# Patient Record
Sex: Male | Born: 1945
Health system: Southern US, Community
[De-identification: ages and names within clinical notes are randomized; demographics above are authoritative.]

## PROBLEM LIST (undated history)

## (undated) DIAGNOSIS — E119 Type 2 diabetes mellitus without complications: Secondary | ICD-10-CM

## (undated) DIAGNOSIS — R000 Tachycardia, unspecified: Secondary | ICD-10-CM

## (undated) DIAGNOSIS — N4340 Spermatocele of epididymis, unspecified: Secondary | ICD-10-CM

## (undated) DIAGNOSIS — Z860101 Personal history of adenomatous and serrated colon polyps: Secondary | ICD-10-CM

## (undated) DIAGNOSIS — Z86010 Personal history of colonic polyps: Secondary | ICD-10-CM

## (undated) DIAGNOSIS — I10 Essential (primary) hypertension: Secondary | ICD-10-CM

## (undated) DIAGNOSIS — N400 Enlarged prostate without lower urinary tract symptoms: Secondary | ICD-10-CM

## (undated) DIAGNOSIS — E785 Hyperlipidemia, unspecified: Secondary | ICD-10-CM

## (undated) DIAGNOSIS — Z8679 Personal history of other diseases of the circulatory system: Secondary | ICD-10-CM

## (undated) DIAGNOSIS — I4510 Unspecified right bundle-branch block: Secondary | ICD-10-CM

## (undated) HISTORY — PX: TONSILLECTOMY: SUR1361

## (undated) HISTORY — PX: OTHER SURGICAL HISTORY: SHX169

## (undated) HISTORY — DX: Essential (primary) hypertension: I10

---

## 1997-12-02 ENCOUNTER — Inpatient Hospital Stay (HOSPITAL_COMMUNITY): Admission: EM | Admit: 1997-12-02 | Discharge: 1997-12-03 | Payer: Self-pay | Admitting: Cardiology

## 1997-12-13 ENCOUNTER — Ambulatory Visit (HOSPITAL_COMMUNITY): Admission: RE | Admit: 1997-12-13 | Discharge: 1997-12-13 | Payer: Self-pay | Admitting: Internal Medicine

## 1998-05-17 DIAGNOSIS — Z8679 Personal history of other diseases of the circulatory system: Secondary | ICD-10-CM

## 1998-05-17 HISTORY — DX: Personal history of other diseases of the circulatory system: Z86.79

## 1998-09-15 HISTORY — PX: CARDIAC CATHETERIZATION: SHX172

## 1998-10-06 ENCOUNTER — Inpatient Hospital Stay (HOSPITAL_COMMUNITY): Admission: EM | Admit: 1998-10-06 | Discharge: 1998-10-07 | Payer: Self-pay | Admitting: Cardiology

## 1998-10-06 HISTORY — PX: TRANSTHORACIC ECHOCARDIOGRAM: SHX275

## 1998-10-20 ENCOUNTER — Ambulatory Visit (HOSPITAL_COMMUNITY): Admission: RE | Admit: 1998-10-20 | Discharge: 1998-10-20 | Payer: Self-pay | Admitting: Internal Medicine

## 2003-04-01 ENCOUNTER — Ambulatory Visit (HOSPITAL_COMMUNITY): Admission: RE | Admit: 2003-04-01 | Discharge: 2003-04-01 | Payer: Self-pay | Admitting: Internal Medicine

## 2006-08-19 ENCOUNTER — Emergency Department (HOSPITAL_COMMUNITY): Admission: EM | Admit: 2006-08-19 | Discharge: 2006-08-19 | Payer: Self-pay | Admitting: Emergency Medicine

## 2007-01-09 ENCOUNTER — Ambulatory Visit: Payer: Self-pay | Admitting: Cardiovascular Disease

## 2007-01-09 ENCOUNTER — Observation Stay (HOSPITAL_COMMUNITY): Admission: EM | Admit: 2007-01-09 | Discharge: 2007-01-10 | Payer: Self-pay | Admitting: Emergency Medicine

## 2008-04-23 ENCOUNTER — Ambulatory Visit: Payer: Self-pay | Admitting: Internal Medicine

## 2008-05-07 ENCOUNTER — Ambulatory Visit (HOSPITAL_COMMUNITY): Admission: RE | Admit: 2008-05-07 | Discharge: 2008-05-07 | Payer: Self-pay | Admitting: Urology

## 2008-05-14 ENCOUNTER — Ambulatory Visit: Payer: Self-pay | Admitting: Internal Medicine

## 2008-05-14 ENCOUNTER — Ambulatory Visit (HOSPITAL_COMMUNITY): Admission: RE | Admit: 2008-05-14 | Discharge: 2008-05-14 | Payer: Self-pay | Admitting: Internal Medicine

## 2008-05-14 ENCOUNTER — Encounter: Payer: Self-pay | Admitting: Internal Medicine

## 2009-06-17 ENCOUNTER — Ambulatory Visit (HOSPITAL_COMMUNITY): Admission: RE | Admit: 2009-06-17 | Discharge: 2009-06-17 | Payer: Self-pay | Admitting: Family Medicine

## 2010-05-25 ENCOUNTER — Ambulatory Visit (HOSPITAL_COMMUNITY)
Admission: RE | Admit: 2010-05-25 | Discharge: 2010-05-25 | Payer: Self-pay | Source: Home / Self Care | Attending: Family Medicine | Admitting: Family Medicine

## 2010-09-29 NOTE — H&P (Signed)
[name], Hunter Peterson             ACCOUNT NO.:  [id number]   MEDICAL RECORD NO.:  [id number]          PATIENT TYPE:  INP   LOCATION:  A208                          FACILITY:  [facility]   PHYSICIAN:  Skeet Latch, DO    DATE OF BIRTH:  [birth year]   DATE OF ADMISSION:  01/09/2007  DATE OF DISCHARGE:  LH                              HISTORY & PHYSICAL   CHIEF COMPLAINT:  Chest pain.   HISTORY OF PRESENT ILLNESS:  This is a 65 year old Caucasian male who  presents with complaint of epigastric chest pain.  The patient states  that the pain started this a.m. while sitting.  The patient describes  the pain as sharp in nature with a pressure-type sensation in the middle  of his chest.  The patient denies any radiation to his back, his jaw or  any extremities.  The patient does state that it makes him nauseated and  he had some episodes of sweating earlier today.  The patient states that  after having this pain this a.m., the patient procrastinated.  He did  not seek any help, but waited until this evening.  The patient then  drove himself to emergency room for evaluation.  The patient states that  the nitroglycerin given in emergency room has not helped the pain.  The  patient continues to have epigastric-type pain/pressure.  The patient  also admits to not having a bowel movement for approximately 3-4 days  and states that this feels like gas .   PAST MEDICAL HISTORY:  1. Non-insulin dependent diabetes.  2. Hyperlipidemia.  3. Hypertension.  4. Episode of arrhythmia approximately 10 years prior.   PAST SURGICAL HISTORY:  None.   ALLERGIES:  None.   REVIEW OF SYSTEMS:  GENERAL:  Positive for some sweating.  Denies any  weakness, weight loss, change in appetite, dizziness, fatigue.  HEENT:  No eye pain, visual changes.  NECK:  No throat problems.  CARDIOVASCULAR:  Positive for epigastric pain.  Denies any palpitations.  RESPIRATORY: Denies any cough, wheezing, shortness of breath.  GASTROINTESTINAL:  Positive for nausea.  Denies any vomiting, diarrhea,  positive for constipation.  GENITOURINARY: No dysuria, urgency.  MUSCULOSKELETAL: No arthralgias or arthritis.  Other systems are  negative.   HOME MEDICATIONS:  The patient is on Toprol XL once a day, Avandia and  Lipitor.   PHYSICAL EXAMINATION:  VITAL SIGNS: Temperature is 97.4, respirations  24, heart rate 74, blood pressure 117/73.  The patient is sating 95% on  2 liters.  HEENT: Head is atraumatic, normocephalic.  PERRL.  EOMI.  NECK:  Soft, supple, nontender, nondistended.  Oral mucosa was moist.  NECK:  Supple, soft.  CARDIOVASCULAR:  Regular rate and rhythm.  No murmurs, rubs, gallops.  RESPIRATORY:  Breath sounds equal bilaterally.  No rales, rhonchi or  wheezing.  CHEST:  Clear.  ABDOMEN:  Soft.  Slight tenderness in epigastric area.  No rigidity or  guarding.  No hepatosplenomegaly.  Positive positive bowel sounds.  EXTREMITIES:  No clubbing, cyanosis or edema.  NEUROLOGIC: Cranial nerves II-XII grossly intact. Moves all extremities.  No clubbing, cyanosis  or edema.   LABORATORY:  Hemoglobin 15.3, hematocrit 45.4, white count 12.1,  platelets 178.  Sodium 139, potassium 3.7, chloride 103, CO2 30, BUN 15,  creatinine 0.93, glucose 171, PTT 13.5, INR 1.0, PTT 34, AST 50, ALT 24,  alkaline phosphatase 48, total bilirubin is 2.1.  So far, three sets of  troponins and negative.  TSH is pending.   ASSESSMENT:  1. Atypical chest pain.  2. Non-insulin-dependent diabetes.  3. Hypertension.  4. Hyperlipidemia.   PLAN:  1. For the patient's chest pain, will consult Cardiology secondary the      patient's risk factors.  Will also give the patient Mylicon and      Fleet's enema as well as Colace to stimulate his bowels.  The      patient has nitroglycerin q. 5 minutes p.r.n. as needed.  Will      repeat EKG in a.m.  2. For his non-insulin-dependent diabetes, will get blood sugars at      a.c. and h.s.   The patient will be placed on sensitive sliding      scale and hemoglobin A1c will be checked in the a.m..  3. For hypertension, tends to be stable at this time.  Continue his      home medications.  4. For his hyperlipidemia, continue his home medications and check a      lipid panel in the a.m..  The patient will be given DVT as well as      GI prophylaxis.      Skeet Latch, DO  Electronically Signed     SM/MEDQ  D:  01/10/2007  T:  01/10/2007  Job:  [id number]

## 2010-09-29 NOTE — Discharge Summary (Signed)
Hunter Peterson, Hunter Peterson             ACCOUNT NO.:  [id number]   MEDICAL RECORD NO.:  [id number]          PATIENT TYPE:  INP   LOCATION:  A208                          FACILITY:  [facility]   PHYSICIAN:  Dorris Singh, DO    DATE OF BIRTH:  1945/07/10   DATE OF ADMISSION:  01/09/2007  DATE OF DISCHARGE:  08/26/2008LH                               DISCHARGE SUMMARY   ADMISSION DIAGNOSES:  1. Atypical chest pain.  2. Non-insulin-dependent diabetes.  3. Hypertension.  4. Hyperlipidemia.   DISCHARGE DIAGNOSES:  1. Atypical chest pain.  2. Non-insulin-dependent diabetes.  3. Hypertension.  4. Hyperlipidemia.   PRIMARY CARE PHYSICIAN:  Dr. Patrica Duel.   CONSULTATIONS:  The patient was seen by Dr. Eden Emms of [facility]  since he has not had a cardiologist in a long time.   PROCEDURES:  1. On January 09, 2007 patient had a portable chest x-ray which      demonstrated questionable pneumonia versus atelectasis in the right      lobe and right middle lobe.  2. CT of the chest revealed bibasilar atelectasis, no evidence of      aortic distention or pulmonary embolism.  3. CT of abdomen revealed no acute upper abdominal abnormalities.   HISTORY AND PHYSICAL:  The patient is a 65 year old Caucasian male  presenting with complaint of epigastric chest pain.  He states this  started this a.m. while sitting and he described the pain as sharp in  nature with a pressure type sensation in the middle of his chest.  The  patient denies any radiation to his back,  jaw or extremities.  He does  not state that it makes him nauseated but has had some episodes of  sweating earlier.  At this point in time he said the pain did not go  away and he drove himself to the emergency room for evaluation.  Upon  evaluation patient had CT angiogram done of chest and abdomen as well as  a chest x-ray.  His hemoglobin and his labs were within normal limits.  Patient's pertinent positive was that his white  count was 12.1 thousand  on admission but is 11.6 thousand today.  All other labs are within  normal limits except for a glucose of 333 today.   HOSPITAL COURSE:  The patient denied any chest pain during the night,  did not have any reproduction of that pain and was seen by the night  Hospitalist who actually felt like he was doing better.  Cardiology saw  him this morning, Dr. Eden Emms, due to patient's past medical history with  a stress test several years ago. Patient actually went into ventricular  tachycardia while having the test and he does not want a stress test  done.  At this point in time it was determined that patient could be see  the cardiologist of his choice from Dr. Geanie Logan recommendations per  patient request that he have a cardiac catheterization to determine if  there is any other issue regarding heart disease at this time.  At this  point in time patient did not want to  pursue any more testing which  includes a stress test and would wait until he met with his family  practitioner.  At this point in time patient still has an elevated white  count. I will send him home on empiric antibiotics and will have him  follow up with Dr. Geanie Logan office in 1-3 days.  He is discharged to  home and he is stable.   He will be discharged on his home medications which include:  1. Toprol once daily.  2. Avandia.  3. Lipitor. (We do not have doses for his current medications).  4. Also he will be sent home on Levaquin 500 mg p.o. daily x5 days.   The patient understands the importance of followup with cardiology to  determine etiology of this chest pain and states that he is concerned  and would like to find out why this occurred.      Dorris Singh, DO  Electronically Signed     CB/MEDQ  D:  01/10/2007  T:  01/10/2007  Job:  [id number]   cc:   Noralyn Pick. Eden Emms, MD, [facility]  [address]  [address]  [place]  [place] [address]   Patrica Duel, M.D.  Fax: [phone]

## 2010-09-29 NOTE — Op Note (Signed)
NAME:  Hunter Peterson, Hunter Peterson             ACCOUNT NO.:  [id number]   MEDICAL RECORD NO.:  [id number]          PATIENT TYPE:  AMB   LOCATION:  DAY                           FACILITY:  [facility]   PHYSICIAN:  R. Roetta Sessions, M.D. DATE OF BIRTH:  May 23, 1945   DATE OF PROCEDURE:  05/14/2008  DATE OF DISCHARGE:                               OPERATIVE REPORT   INDICATIONS FOR PROCEDURE:  A 65 year old gentleman with history of  adenomatous polyps.  Last colonoscopy in 2004, he had a rectal polyp  removed which was an adenoma.  He is devoid of any lower GI tract  symptoms.  Colonoscopy is now being done.  Risks, benefits,  alternatives, and limitations have been reviewed, questions answered,  all parties agreeable.  Please see the documentation in the medical  record.   PROCEDURE NOTE:  O2 saturation, blood pressure, pulse, and respirations  were monitored throughout the entire procedure.   CONSCIOUS SEDATION:  Versed 4 mg IV and Demerol 75 mg IV in divided  doses.   INSTRUMENT:  Pentax video chip system.   FINDINGS:  Digital rectal exam revealed no abnormalities.  Endoscopic  findings:  These prep was good.  Colon:  Colonic mucosa was surveyed  from the rectosigmoid junction through the left transverse, right colon,  appendiceal orifice, ileocecal valve, and cecum.  These structures were  well seen and photographed for the record.  Terminal ileum was elevated  to 5 cm.  From this level, scope was slowly and cautiously withdrawn.  All previously mentioned mucosal surfaces were again seen.  The patient  had a 6-mm pedunculated polyp on a fold in the cecum, which was hot  snare removed, recovered through the scope.  The remainder of the  colonic mucosa appeared normal.  Terminal ileum mucosa appeared normal.  The scope was pulled down in the rectum with examination of the rectal  mucosa including retroflexion, anal verge demonstrated area of scar  which corresponded to the site of prior  polypectomy.  Otherwise rectal  mucosa appeared entirely normal.  The patient tolerated the procedure  well and reacted in Endoscopy.   IMPRESSION:  1. Rectal scar from prior polypectomy, otherwise normal rectal mucosa.  2. Pedunculated polyp in the cecum, status post hot snare polypectomy.      Remainder colonic mucosa appeared normal, normal terminal ileum.   RECOMMENDATION:  1. No aspirin or arthritis medications for 5 days.  2. Follow up on path.  3. Further recommendations to follow.      Jonathon Bellows, M.D.  Electronically Signed     RMR/MEDQ  D:  05/14/2008  T:  05/14/2008  Job:  [id number]   cc:   Patrica Duel, M.D.  Fax: [phone]

## 2010-09-29 NOTE — H&P (Signed)
NAME:  Polio, Ancel             ACCOUNT NO.:  [id number]   MEDICAL RECORD NO.:  [id number]          PATIENT TYPE:  AMB   LOCATION:  DAY                           FACILITY:  [facility]   PHYSICIAN:  R. Roetta Sessions, M.D.      DATE OF BIRTH:   DATE OF ADMISSION:  DATE OF DISCHARGE:  LH                              HISTORY & PHYSICAL   PRIMARY CARE PHYSICIAN:  Patrica Duel, MD   PHYSICIAN SIGNING THIS NOTE:  R. Roetta Sessions, MD   CHIEF COMPLAINT:  History of adenomatous polyps due for surveillance  colonoscopy.   HISTORY OF PRESENT ILLNESS:  Mr. Brandenburg is a 65 year old Caucasian  male.  He had a screening colonoscopy by Dr. Jena Gauss on April 01, 2003.  He was found to have a pedunculated rectal polyp which was removed with  snare cautery, it was an adenomatous polyp.  He is due for surveillance.  He denies any GI problems at this time to specifically include rectal  bleeding, melena, constipation, diarrhea, abdominal pain, nausea, or  vomiting.  Denies any history of heartburn or indigestion.  Denies any  dysphagia, odynophagia, anorexia, or early satiety.  His weight has  remained stable.   PAST MEDICAL AND SURGICAL HISTORY:  Diabetes mellitus, hypertension,  hypercholesterolemia, arrhythmia, and he has recently been treated for  prostatitis and is having continued prostate problems for which she has  been referred to Urology with an appointment pending.  He is status post  tonsillectomy.   CURRENT MEDICATIONS:  1. Avandia 4 mg daily.  2. Toprol 25 mg daily.  3. Lipitor 10 mg daily.   ALLERGIES:  No known drug allergies.   FAMILY HISTORY:  There is no known family history of colorectal  carcinoma, liver or chronic GI problems.  Mother deceased at 70 with  history of dementia.  Father deceased at 61 with history of lymphoma.  He has 3 healthy siblings.   SOCIAL HISTORY:  Mr. Demmer is married.  He has 3 grown healthy  children.  He is employed with [employer].   He denies any  tobacco or drug use.  He consumes an occasional beer, consumes about 4  ounces of red wine in the evenings.   REVIEW OF SYSTEMS:  See HPI.  He is having some left scrotal pain and  swelling, again which is being referred to Urology at this point,  otherwise denies any problems.  See HPI.   PHYSICAL EXAMINATION:  VITAL SIGNS:  Weight 196 pounds, height 71  inches, temp 97.5, blood pressure 140/90, and pulse 64.  GENERAL:  He is well-developed, well-nourished Caucasian male in no  acute distress.  HEENT:  Sclerae clear, nonicteric.  Conjunctivae pink.  Oropharynx pink  and moist without any lesions.  NECK:  Supple without mass or thyromegaly.  CHEST:  Heart regular rate and rhythm.  Normal S1 and S2 without  murmurs, clicks, rubs, or gallops.  LUNGS:  Clear to auscultation bilaterally.  ABDOMEN:  Positive bowel sounds x4.  No bruits auscultated.  Soft,  nontender, nondistended without palpable mass or hepatosplenomegaly.  No  rebound tenderness or guarding.  I was unable to appreciate any hernias.  RECTAL:  Deferred.  EXTREMITIES:  Without clubbing or edema.   IMPRESSION:  Mr. Strey is a 65 year old Caucasian male with history of  adenomatous rectal polyp.  He is due for surveillance colonoscopy at  this time.  He denies any GI complaints.   PLAN:  1. Surveillance colonoscopy with Dr. Jena Gauss in near future.  Discuss      procedure risks and benefits, which include but not limited to      bleeding, infection, perforation, drug reaction.  He agrees and      planned consent will be obtained.  2. He is going to take half of his Avandia the day of the prep, as he      is concerned with hypoglycemia he has experienced previously.  He      will have early morning colonoscopy appointment and will bring his      Avandia with him to the hospital to take after the procedure.      Lorenza Burton, N.P.      Jonathon Bellows, M.D.  Electronically Signed    KJ/MEDQ   D:  04/23/2008  T:  04/24/2008  Job:  [id number]   cc:   Patrica Duel, M.D.  Fax: [phone]

## 2010-09-29 NOTE — Consult Note (Signed)
NAME:  Hunter Peterson, Hunter Peterson             ACCOUNT NO.:  [id number]   MEDICAL RECORD NO.:  [id number]          PATIENT TYPE:  INP   LOCATION:  A208                          FACILITY:  [facility]   PHYSICIAN:  Peter C. Eden Emms, MD, FACCDATE OF BIRTH:  22-Jun-1945   DATE OF CONSULTATION:  01/10/2007  DATE OF DISCHARGE:                                 CONSULTATION   Hunter Peterson is a delightful 65 year old patient who we were asked to  help evaluate by the [facility] service, Dr. Edsel Petrin, for chest  pain.   The patient had been doing well up until Sunday.  He went to [place] to visit his daughter.  He slept in a motel room.  He started  having left shoulder pain.  The pain sounded more musculoskeletal.  He  subsequently started having some epigastric type pain and pressure.  He  could not belch.  He got constipated.  He had some diaphoresis.  He went  to see Dr. Patrica Duel who did an EKG which was unrevealing.  However,  Mark sent him to the emergency room and he was admitted.   He did have nitroglycerin in the emergency room and this was not  helpful.  Again in the emergency room, his primary complaint revolved  around his GI tract with inability to belch and the lack of a bowel  movement in three to four days.  He said the pressure in his epigastric  and chest area did feel like gas.   His coronary risk factors include positive family history of  hyperlipidemia, hypertension, non-insulin dependent diabetes.   His pain is currently improved.  He does not carry a previous history of  gallbladder disease, reflux or diverticulitis despite having epigastric  pain, constipation.  He did not have any significant vomiting.  There  was slight nausea and diaphoresis.   Patient's past cardiac history is somewhat interesting.  Back in June  2000, he was referred for a stress test.  He describes it as a very bad  experience.  He said what sounds like a PA had done the test.  He felt  they  were not paying attention.  The patient apparently had exercise  induced VT and felt very ill.  He had to get transferred to the  emergency room and defibrillated.  He subsequently had an extensive work-  up.  I actually performed his heart cath back in 2000 which was entirely  normal.  He had a normal echo.  He also had an EP study and despite a  very aggressive protocol.  He was noninducible.  The thought was that he  may have had a triggered VT from exercise and he was sent home on beta-  blockers.  He has done extremely well over the last eight years.   In regards to this, he has not had any previous chest pain, he has not  had palpitations or syncope.   The patient has recently been diagnosed with diabetes over the past  year.  He tries to be compliant with his diet.   He is fairly sedentary.  REVIEW OF SYSTEMS:  Otherwise negative.   PAST MEDICAL HISTORY:  1. Non-insulin dependent diabetes.  2. Hyperlipidemia.  3. History of exercise induced VT.  4. He has not had any previous surgeries.   ALLERGIES:  He denies any allergies.   HOME MEDICATIONS:  1. Toprol, I believe is 50 mg a day.  2. Avandia, dose unknown.  3. Cholesterol medicine, again not well documented in the chart, but      the patient seems to indicate it is a Statin drug.   MEDICATIONS IN-HOSPITAL:  1. Zithromax 500 mg.  2. An aspirin a day.  3. Protonix 40 a day.  4. Nitroglycerin p.r.n.  5. Xanax.  6. Ambien.  7. Ceftriaxone 1 g.  8. Xopenex.  9. Hydrocodone.   SOCIAL HISTORY:  Remarkable for being happily married for 38 years.  His  wife is with him in the room.  He has two children, one in [place], one in [place], [place], and one grandchild.  He  works for [employer] as a Pensions consultant.  His passion is restoring  old cars.  He has more than 10 vehicles at home.  He does not smoke or  drink.   His mother died of old age.  His father died of heart problems.  His  initial  heart problems started in his 20s.   PHYSICAL EXAMINATION:  VITAL SIGNS:  Temperature 97.1, pulse 80 and  regular, respirations 20, blood pressure 121/79, sats 92% on room air,  CBGs this morning are 146.  Weight is not recorded.  GENERAL APPEARANCE:  A healthy-appearing, middle aged white male in no  acute distress.  Affect is appropriate.  HEENT:  Normal.  NECK:  Carotids normal without bruit.  There is no lymphadenopathy, no  thyromegaly, no JVP elevation.  LUNGS:  His lungs are currently clear with mild atelectasis at the right  base, no wheezing.  CARDIOVASCULAR:  There is an S1 and S2 with normal heart sounds.  PMI is  normal.  ABDOMEN:  Currently benign.  Bowel sounds are positive.  There is no  tenderness, no  hepatosplenomegaly.  No hepatojugular reflux, no bruits,  no AAA.  He has no right upper quadrant tenderness.  EXTREMITIES:  Femorals are +4 bilaterally without bruit.  PTs are +3.  There is no lower extremity edema.  NEUROLOGIC:  Nonfocal.  There is no muscular weakness.  SKIN:  Warm and dry.   His blood work is remarkable for a TSH of 1.8, potassium 3.9, BUN 18,  creatinine 0.7.  White count was elevated at 11.6, hematocrit was normal  at 41.  He has mild elevation in his bilirubin at 2.1 with indirect of  1.8.  Troponins are negative x2 at 0.05.   He had a CT of the abdomen and chest which were unrevealing.  There was  a question of right lower lobe and right middle lobe atelectasis.   His EKG showed sinus rhythm with a chronic right bundle branch block  which was documented over eight years ago.   IMPRESSION:  1. Atypical chest or epigastric pain.  He has ruled out for myocardial      infarction.  I will order 2-D echocardiograms.  As long as it is      normal, I suspect he can be discharged in a day or two.  The      patient flat out told me he would not have another stress test.      This  was a bad experience eight years ago when he went into       ventricular tachycardia.  I explained to him that he does have      multiple risk factors and the only other way to screen him would be      with a heart cath.  I am not sure if insurance would cover a      cardiac CT, however, I think these issues can be dealt with as an      outpatient.  I would continue his aspirin and beta-blocker therapy.  2. History of exercise induced ventricular tachycardia, noninducible      by electrophysiology study, no recurrence of palpitations or      syncope.  This would appear to have been a benign, possibly      triggered event that has not been reproducible and not clinically      significant.  Continue beta-blocker.  3. Question atelectasis or pneumonia.  The patient is on multiple      antibiotics that he should be simplified.  He is not acting      infected, although he did have an elevated white count.  Incentive      spirometry and p.o. antibiotics should probably suffice.  Follow-up      chest x-ray would be indicated.  4. Non-insulin dependent diabetes.  Need to get his home medication      list.  I do not see that he is on Avandia here.  The patient should      have a hemoglobin A1c.  His fasting sugar appeared elevated this      morning.  We will leave this up to his primary service.  5. Hypertension.  Currently well controlled.  Again, I do not see a      home medication list but clearly he is on beta-blockers and I      suspect he is on an ACE inhibitor.  Tight control of this with his      other risk factors will be important.  6. Hyperlipidemia.  Again, talked to Dr. Nobie Putnam.  Checked fasting      lipid and liver profile.  Goal for LDL would be under 100.      Elevated white count and mildly elevated bilirubin.  His elevated      bilirubin may very well represent Gilbert's disease.  He is not      acting like he has significant ductal gallbladder disease.  He has      no tenderness.  His white count should be followed.  If there is       any question of increasing bilirubins, he should have a right upper      quadrant ultrasound, however, his abdominal CT scan on admission      was negative.   We will arrange outpatient follow-up for the patient.  Again, he  indicated he would be more amenable to a heart cath than any repeat  stress testing.   From our prospective, as long as his echo shows a normal left  ventricular function, he can be discharged tomorrow whenever these other  issues are resolved.      Noralyn Pick. Eden Emms, MD, [facility]  Electronically Signed     PCN/MEDQ  D:  01/10/2007  T:  01/10/2007  Job:  [id number]

## 2010-10-02 NOTE — Op Note (Signed)
NAME:  Hunter Peterson, Hunter Peterson                       ACCOUNT NO.:  [id number]   MEDICAL RECORD NO.:  [id number]                   PATIENT TYPE:  AMB   LOCATION:  DAY                                  FACILITY:  [facility]   PHYSICIAN:  R. Roetta Sessions, M.D.              DATE OF BIRTH:  June 04, 1945   DATE OF PROCEDURE:  04/01/2003  DATE OF DISCHARGE:                                 OPERATIVE REPORT   PROCEDURE:  Colonoscopy with snare polypectomy.   INDICATIONS FOR PROCEDURE:  Mr. Geisel is a 65 year old gentleman sent over  by the courtesy of Dr. Nobie Putnam for colorectal cancer screening. He was  devoid of any lower GI tract symptoms, there is no family history of  colorectal neoplasia and he has never had his colon imaged. Colonoscopy is  now being done as a standard screening maneuver. This approach has been  discussed with the patient at length. The potential risks, benefits, and  alternatives have been reviewed. Please see my handwritten H&P for more  information.   MONITORING:  O2 saturation, blood pressure, pulse and respirations were  monitored throughout the entire procedure.   CONSCIOUS SEDATION:  Versed 3 mg IV, Demerol 50 mg IV in divided doses.   INSTRUMENT:  Olympus videochip adult colonoscope.   FINDINGS:  Digital rectal exam revealed no abnormalities.   ENDOSCOPIC FINDINGS:  The prep was good.   RECTUM:  Examination of the rectal mucosa including retroflexed view of the  anal verge revealed a pedunculated 1 cm polyp at 12 cm. Please see photos.  The remainder of the rectal mucosa appeared normal.   COLON:  The colonic mucosa was surveyed from the rectosigmoid junction  through the left transverse right colon to the area of the appendiceal  orifice, ileocecal valve and cecum. These structures were seen and  photographed for the record. The colonic mucosa all the way to the cecum  appeared normal. The terminal ileum was easily intubated and the distal TI  appeared normal  as well. From the level of the ileocecal valve, the scope  was slowly withdrawn. All previously mentioned mucosal surfaces were again  seen and no other abnormalities were observed. The polyp in the rectum was  removed with snare cautery and recovered through the scope. The patient  tolerated the procedure well and was reacted in endoscopy.   IMPRESSION:  Pedunculated recta polyp removed with a snare otherwise normal  rectum, normal colon, normal terminal ileum.    RECOMMENDATIONS:  1. No aspirin or arthritis medications for 10 days.  2. Followup on path.  3. Further recommendations to follow.      ___________________________________________                                            Jonathon Bellows, M.D.   RMR/MEDQ  D:  04/01/2003  T:  04/01/2003  Job:  [id number]   cc:   Patrica Duel, M.D.  [address], [address]  [place]  [place] [address]  Fax: [phone]

## 2011-02-26 LAB — CBC
HCT: 41
HCT: 45.4
Hemoglobin: 14.1
Hemoglobin: 15.3
MCHC: 33.6
MCHC: 34.3
MCV: 90.3
MCV: 92.1
Platelets: 174
Platelets: 178
RBC: 4.92
RDW: 13.4
WBC: 12.1 — ABNORMAL HIGH

## 2011-02-26 LAB — BASIC METABOLIC PANEL WITH GFR
BUN: 15
BUN: 18
CO2: 26
CO2: 30
Calcium: 8.6
Calcium: 8.8
Chloride: 103
Chloride: 103
Creatinine, Ser: 0.74
Creatinine, Ser: 0.93
GFR calc non Af Amer: >60
GFR calc non Af Amer: >60
Glucose, Bld: 133 — ABNORMAL HIGH
Glucose, Bld: 171 — ABNORMAL HIGH
Potassium: 3.7
Potassium: 3.9
Sodium: 137
Sodium: 139

## 2011-02-26 LAB — POCT CARDIAC MARKERS
CKMB, poc: <1 — ABNORMAL LOW
CKMB, poc: <1 — ABNORMAL LOW
Myoglobin, poc: 36.7
Myoglobin, poc: 38.8
Myoglobin, poc: 51.4
Operator id: 213931
Operator id: 263501
Troponin i, poc: <0.05
Troponin i, poc: <0.05

## 2011-02-26 LAB — DIFFERENTIAL
Basophils Absolute: 0
Basophils Relative: 0
Eosinophils Absolute: 0
Lymphs Abs: 0.7
Neutro Abs: 10.3 — ABNORMAL HIGH
Neutro Abs: 9.5 — ABNORMAL HIGH
Neutrophils Relative %: 82 — ABNORMAL HIGH
Neutrophils Relative %: 85 — ABNORMAL HIGH

## 2011-02-26 LAB — LIPID PANEL
Cholesterol: 149
HDL: 46
LDL Cholesterol: 85
Total CHOL/HDL Ratio: 3.2
Triglycerides: 88

## 2011-02-26 LAB — PROTIME-INR
INR: 1
Prothrombin Time: 13.5

## 2011-02-26 LAB — HEPATIC FUNCTION PANEL
ALT: 24
AST: 15
Albumin: 4
Alkaline Phosphatase: 48
Bilirubin, Direct: 0.3
Indirect Bilirubin: 1.8 — ABNORMAL HIGH
Total Bilirubin: 2.1 — ABNORMAL HIGH
Total Protein: 7.6

## 2011-02-26 LAB — APTT: aPTT: 34

## 2011-02-26 LAB — CULTURE, BLOOD (ROUTINE X 2)
Culture: NO GROWTH
Culture: NO GROWTH
Culture: NO GROWTH
Culture: NO GROWTH
Report Status: 8302008

## 2011-02-26 LAB — HEMOGLOBIN A1C
Hgb A1c MFr Bld: 6.1
Mean Plasma Glucose: 140

## 2011-02-26 LAB — TSH: TSH: 1.854

## 2013-02-14 DIAGNOSIS — M5380 Other specified dorsopathies, site unspecified: Secondary | ICD-10-CM | POA: Diagnosis not present

## 2013-02-14 DIAGNOSIS — Z125 Encounter for screening for malignant neoplasm of prostate: Secondary | ICD-10-CM | POA: Diagnosis not present

## 2013-02-14 DIAGNOSIS — Z6825 Body mass index (BMI) 25.0-25.9, adult: Secondary | ICD-10-CM | POA: Diagnosis not present

## 2013-02-14 DIAGNOSIS — E785 Hyperlipidemia, unspecified: Secondary | ICD-10-CM | POA: Diagnosis not present

## 2013-02-14 DIAGNOSIS — E119 Type 2 diabetes mellitus without complications: Secondary | ICD-10-CM | POA: Diagnosis not present

## 2013-02-14 DIAGNOSIS — Z23 Encounter for immunization: Secondary | ICD-10-CM | POA: Diagnosis not present

## 2013-02-14 DIAGNOSIS — I10 Essential (primary) hypertension: Secondary | ICD-10-CM | POA: Diagnosis not present

## 2013-04-17 ENCOUNTER — Encounter: Payer: Self-pay | Admitting: Internal Medicine

## 2013-05-23 DIAGNOSIS — E119 Type 2 diabetes mellitus without complications: Secondary | ICD-10-CM | POA: Diagnosis not present

## 2013-05-23 DIAGNOSIS — E785 Hyperlipidemia, unspecified: Secondary | ICD-10-CM | POA: Diagnosis not present

## 2013-05-23 DIAGNOSIS — I10 Essential (primary) hypertension: Secondary | ICD-10-CM | POA: Diagnosis not present

## 2013-05-23 DIAGNOSIS — IMO0002 Reserved for concepts with insufficient information to code with codable children: Secondary | ICD-10-CM | POA: Diagnosis not present

## 2013-07-17 DIAGNOSIS — E785 Hyperlipidemia, unspecified: Secondary | ICD-10-CM | POA: Diagnosis not present

## 2013-07-17 DIAGNOSIS — E119 Type 2 diabetes mellitus without complications: Secondary | ICD-10-CM | POA: Diagnosis not present

## 2013-07-17 DIAGNOSIS — Z6826 Body mass index (BMI) 26.0-26.9, adult: Secondary | ICD-10-CM | POA: Diagnosis not present

## 2013-07-17 DIAGNOSIS — I10 Essential (primary) hypertension: Secondary | ICD-10-CM | POA: Diagnosis not present

## 2013-08-01 DIAGNOSIS — IMO0002 Reserved for concepts with insufficient information to code with codable children: Secondary | ICD-10-CM | POA: Diagnosis not present

## 2013-08-01 DIAGNOSIS — Z6826 Body mass index (BMI) 26.0-26.9, adult: Secondary | ICD-10-CM | POA: Diagnosis not present

## 2013-08-28 DIAGNOSIS — E119 Type 2 diabetes mellitus without complications: Secondary | ICD-10-CM | POA: Diagnosis not present

## 2013-08-28 DIAGNOSIS — I10 Essential (primary) hypertension: Secondary | ICD-10-CM | POA: Diagnosis not present

## 2013-08-28 DIAGNOSIS — Z6826 Body mass index (BMI) 26.0-26.9, adult: Secondary | ICD-10-CM | POA: Diagnosis not present

## 2013-08-28 DIAGNOSIS — E785 Hyperlipidemia, unspecified: Secondary | ICD-10-CM | POA: Diagnosis not present

## 2013-08-29 ENCOUNTER — Other Ambulatory Visit (HOSPITAL_COMMUNITY): Payer: Self-pay | Admitting: Family Medicine

## 2013-08-29 DIAGNOSIS — Z0000 Encounter for general adult medical examination without abnormal findings: Secondary | ICD-10-CM | POA: Diagnosis not present

## 2013-08-29 DIAGNOSIS — Z6825 Body mass index (BMI) 25.0-25.9, adult: Secondary | ICD-10-CM | POA: Diagnosis not present

## 2013-08-29 DIAGNOSIS — R1032 Left lower quadrant pain: Secondary | ICD-10-CM | POA: Diagnosis not present

## 2013-08-31 ENCOUNTER — Ambulatory Visit (HOSPITAL_COMMUNITY)
Admission: RE | Admit: 2013-08-31 | Discharge: 2013-08-31 | Disposition: A | Discharge status: Home / Self Care | Payer: Medicare Other | Source: Ambulatory Visit | Attending: Family Medicine | Admitting: Family Medicine

## 2013-08-31 DIAGNOSIS — R1032 Left lower quadrant pain: Secondary | ICD-10-CM | POA: Diagnosis not present

## 2013-08-31 DIAGNOSIS — I7090 Unspecified atherosclerosis: Secondary | ICD-10-CM | POA: Insufficient documentation

## 2013-08-31 DIAGNOSIS — K8020 Calculus of gallbladder without cholecystitis without obstruction: Secondary | ICD-10-CM | POA: Insufficient documentation

## 2013-08-31 MED ORDER — IOHEXOL 300 MG/ML  SOLN
100.0000 mL | Freq: Once | INTRAMUSCULAR | Status: AC | PRN
  Administered 2013-08-31: 100 mL via INTRAVENOUS

## 2013-09-13 DIAGNOSIS — Z86010 Personal history of colonic polyps: Secondary | ICD-10-CM | POA: Diagnosis not present

## 2013-09-13 NOTE — H&P (Signed)
  NTS SOAP Note  Vital Signs:  Vitals as of: 08/23/8117: Systolic 147: Diastolic 89: Heart Rate 62: Temp 97.14F: Height 27ft 10in: Weight 186Lbs 0 Ounces: BMI 26.69  BMI : 26.69 kg/m2  Subjective: This 68 Years 47 Months old Male presents for need for colonscopy.  Last had a TCS 6 years ago for follow up of colon polyps.  Denies any gi complaints.  No family h/o colon cancer.   Review of Symptoms:  Constitutional:unremarkable   Head:unremarkable    Eyes:unremarkable   Nose/Mouth/Throat:unremarkable Cardiovascular:  unremarkable   Respiratory:unremarkable   Gastrointestinal:  unremarkable   Genitourinary:    frequency Musculoskeletal:unremarkable   Skin:unremarkable Hematolgic/Lymphatic:unremarkable     Allergic/Immunologic:unremarkable     Past Medical History:    Reviewed  Past Medical History  Surgical History: unremarkable Medical Problems: NIDDM, HTN, high cholesterol levels Allergies: nkda Medications: metformin, metoprolol, simvastatin, enalapril, tamsulosin   Social History:Reviewed  Social History  Preferred Language: English Race:  White Ethnicity: Not Hispanic / Latino Age: 68 Years 5 Months Marital Status:  M Alcohol: unknown   Smoking Status: Never smoker reviewed on 09/13/2013 Functional Status reviewed on 09/13/2013 ------------------------------------------------ Bathing: Normal Cooking: Normal Dressing: Normal Driving: Normal Eating: Normal Managing Meds: Normal Oral Care: Normal Shopping: Normal Toileting: Normal Transferring: Normal Walking: Normal Cognitive Status reviewed on 09/13/2013 ------------------------------------------------ Attention: Normal Decision Making: Normal Language: Normal Memory: Normal Motor: Normal Perception: Normal Problem Solving: Normal Visual and Spatial: Normal   Family History:  Reviewed  Family Health History Mother, Deceased; Healthy;  Father,  Deceased; Healthy;     Objective Information: General:  Well appearing, well nourished in no distress. Heart:  RRR, no murmur or gallop.  Normal S1, S2.  No S3, S4.  Lungs:    CTA bilaterally, no wheezes, rhonchi, rales.  Breathing unlabored. Abdomen:Soft, NT/ND, no HSM, no masses.   deferred to procedure  Assessment:Personal h/o colon polyps  Diagnoses: V12.72 History of polyp of colon (Personal history of colonic polyps)  Procedures: 82956 - OFFICE OUTPATIENT NEW 20 MINUTES    Plan:Scheduled for TCS on 10/02/13.   Patient Education:Alternative treatments to surgery were discussed with patient (and family).  Risks and benefits  of procedure including bleeding and perforation were fully explained to the patient (and family) who gave informed consent. Patient/family questions were addressed.  Follow-up:Pending Surgery

## 2013-09-18 ENCOUNTER — Encounter (HOSPITAL_COMMUNITY): Payer: Self-pay | Admitting: Pharmacy Technician

## 2013-10-02 ENCOUNTER — Encounter (HOSPITAL_COMMUNITY)
Admission: RE | Disposition: A | Discharge status: Home / Self Care | Payer: Self-pay | Source: Ambulatory Visit | Attending: General Surgery

## 2013-10-02 ENCOUNTER — Ambulatory Visit (HOSPITAL_COMMUNITY)
Admission: RE | Admit: 2013-10-02 | Discharge: 2013-10-02 | Disposition: A | Discharge status: Home / Self Care | Payer: Medicare Other | Source: Ambulatory Visit | Attending: General Surgery | Admitting: General Surgery

## 2013-10-02 ENCOUNTER — Encounter (HOSPITAL_COMMUNITY): Payer: Self-pay | Admitting: *Deleted

## 2013-10-02 ENCOUNTER — Ambulatory Visit: Admit: 2013-10-02 | Payer: Self-pay | Admitting: General Surgery

## 2013-10-02 DIAGNOSIS — Z860100 Personal history of colon polyps, unspecified: Secondary | ICD-10-CM | POA: Insufficient documentation

## 2013-10-02 DIAGNOSIS — Z1211 Encounter for screening for malignant neoplasm of colon: Secondary | ICD-10-CM | POA: Diagnosis not present

## 2013-10-02 DIAGNOSIS — E119 Type 2 diabetes mellitus without complications: Secondary | ICD-10-CM | POA: Insufficient documentation

## 2013-10-02 DIAGNOSIS — I10 Essential (primary) hypertension: Secondary | ICD-10-CM | POA: Insufficient documentation

## 2013-10-02 DIAGNOSIS — R350 Frequency of micturition: Secondary | ICD-10-CM | POA: Diagnosis not present

## 2013-10-02 DIAGNOSIS — Z79899 Other long term (current) drug therapy: Secondary | ICD-10-CM | POA: Insufficient documentation

## 2013-10-02 DIAGNOSIS — E7800 Pure hypercholesterolemia, unspecified: Secondary | ICD-10-CM | POA: Diagnosis not present

## 2013-10-02 HISTORY — PX: COLONOSCOPY: SHX5424

## 2013-10-02 LAB — GLUCOSE, CAPILLARY: GLUCOSE-CAPILLARY: 118 mg/dL — AB (ref 70–99)

## 2013-10-02 SURGERY — COLONOSCOPY
Anesthesia: Moderate Sedation

## 2013-10-02 SURGERY — COLONOSCOPY
Anesthesia: Monitor Anesthesia Care

## 2013-10-02 MED ORDER — MEPERIDINE HCL 100 MG/ML IJ SOLN
INTRAMUSCULAR | Status: AC
  Filled 2013-10-02: qty 1

## 2013-10-02 MED ORDER — STERILE WATER FOR IRRIGATION IR SOLN
Status: DC | PRN
  Administered 2013-10-02: 09:00:00

## 2013-10-02 MED ORDER — MIDAZOLAM HCL 5 MG/5ML IJ SOLN
INTRAMUSCULAR | Status: AC
  Filled 2013-10-02: qty 5

## 2013-10-02 MED ORDER — SODIUM CHLORIDE 0.9 % IV SOLN
INTRAVENOUS | Status: DC
  Administered 2013-10-02: 1000 mL via INTRAVENOUS

## 2013-10-02 MED ORDER — MIDAZOLAM HCL 5 MG/5ML IJ SOLN
INTRAMUSCULAR | Status: DC | PRN
  Administered 2013-10-02: 3 mg via INTRAVENOUS

## 2013-10-02 MED ORDER — MEPERIDINE HCL 50 MG/ML IJ SOLN
INTRAMUSCULAR | Status: DC | PRN
  Administered 2013-10-02: 50 mg

## 2013-10-02 NOTE — Discharge Instructions (Signed)
Colonoscopy, Care After °Refer to this sheet in the next few weeks. These instructions provide you with information on caring for yourself after your procedure. Your health care provider may also give you more specific instructions. Your treatment has been planned according to current medical practices, but problems sometimes occur. Call your health care provider if you have any problems or questions after your procedure. °WHAT TO EXPECT AFTER THE PROCEDURE  °After your procedure, it is typical to have the following: °· A small amount of blood in your stool. °· Moderate amounts of gas and mild abdominal cramping or bloating. °HOME CARE INSTRUCTIONS °· Do not drive, operate machinery, or sign important documents for 24 hours. °· You may shower and resume your regular physical activities, but move at a slower pace for the first 24 hours. °· Take frequent rest periods for the first 24 hours. °· Walk around or put a warm pack on your abdomen to help reduce abdominal cramping and bloating. °· Drink enough fluids to keep your urine clear or pale yellow. °· You may resume your normal diet as instructed by your health care provider. Avoid heavy or fried foods that are hard to digest. °· Avoid drinking alcohol for 24 hours or as instructed by your health care provider. °· Only take over-the-counter or prescription medicines as directed by your health care provider. °· If a tissue sample (biopsy) was taken during your procedure: °· Do not take aspirin or blood thinners for 7 days, or as instructed by your health care provider. °· Do not drink alcohol for 7 days, or as instructed by your health care provider. °· Eat soft foods for the first 24 hours. °SEEK MEDICAL CARE IF: °You have persistent spotting of blood in your stool 2 3 days after the procedure. °SEEK IMMEDIATE MEDICAL CARE IF: °· You have more than a small spotting of blood in your stool. °· You pass large blood clots in your stool. °· Your abdomen is swollen  (distended). °· You have nausea or vomiting. °· You have a fever. °· You have increasing abdominal pain that is not relieved with medicine. °Document Released: 12/16/2003 Document Revised: 02/21/2013 Document Reviewed: 01/08/2013 °ExitCare® Patient Information ©2014 [employer]. ° °

## 2013-10-02 NOTE — Op Note (Signed)
[facility] [address] [place], [address]   COLONOSCOPY PROCEDURE REPORT  PATIENT: Hunter Peterson, Hunter Peterson  MR#: [id number] BIRTHDATE: Jan 30, 1946 , 67  yrs. old GENDER: Male ENDOSCOPIST: Aviva Signs, MD REFERRED [id number], John PROCEDURE DATE:  10/02/2013 PROCEDURE:   Colonoscopy, screening ASA CLASS:   Class II INDICATIONS:Patient's personal history of colon polyps. MEDICATIONS: Versed 3 mg IV and Demerol 50 mg IV  DESCRIPTION OF PROCEDURE:   After the risks benefits and alternatives of the procedure were thoroughly explained, informed consent was obtained.  A digital rectal exam revealed no abnormalities of the rectum.   The EC-[id number] ([id number])  endoscope was introduced through the anus and advanced to the cecum, which was identified by both the appendix and ileocecal valve. No adverse events experienced.   The quality of the prep was adequate, using Trilyte  The instrument was then slowly withdrawn as the colon was fully examined.      COLON FINDINGS: A normal appearing cecum, ileocecal valve, and appendiceal orifice were identified.  The ascending, hepatic flexure, transverse, splenic flexure, descending, sigmoid colon and rectum appeared unremarkable.  No polyps or cancers were seen. Retroflexed views revealed no abnormalities. The time to cecum=4 minutes 0 seconds.  Withdrawal time=2 minutes 0 seconds.  The scope was withdrawn and the procedure completed. COMPLICATIONS: There were no complications.  ENDOSCOPIC IMPRESSION: Normal colon  RECOMMENDATIONS: Repeat Colonoscopy in 5 years.   eSigned:  Aviva Signs, MD 10/02/2013 9:30 AM   cc:

## 2013-10-02 NOTE — Interval H&P Note (Signed)
History and Physical Interval Note:  10/02/2013 9:13 AM  [place]  has presented today for surgery, with the diagnosis of screening, history of polyps  The various methods of treatment have been discussed with the patient and family. After consideration of risks, benefits and other options for treatment, the patient has consented to  Procedure(s): COLONOSCOPY (N/A) as a surgical intervention .  The patient's history has been reviewed, patient examined, no change in status, stable for surgery.  I have reviewed the patient's chart and labs.  Questions were answered to the patient's satisfaction.     Jamesetta So

## 2013-10-03 ENCOUNTER — Encounter (HOSPITAL_COMMUNITY): Payer: Self-pay | Admitting: General Surgery

## 2013-10-17 DIAGNOSIS — H31019 Macula scars of posterior pole (postinflammatory) (post-traumatic), unspecified eye: Secondary | ICD-10-CM | POA: Diagnosis not present

## 2013-10-17 DIAGNOSIS — H524 Presbyopia: Secondary | ICD-10-CM | POA: Diagnosis not present

## 2013-10-17 DIAGNOSIS — H52229 Regular astigmatism, unspecified eye: Secondary | ICD-10-CM | POA: Diagnosis not present

## 2013-10-17 DIAGNOSIS — H5200 Hypermetropia, unspecified eye: Secondary | ICD-10-CM | POA: Diagnosis not present

## 2013-11-03 DIAGNOSIS — T148XXA Other injury of unspecified body region, initial encounter: Secondary | ICD-10-CM | POA: Diagnosis not present

## 2013-11-05 DIAGNOSIS — Z6825 Body mass index (BMI) 25.0-25.9, adult: Secondary | ICD-10-CM | POA: Diagnosis not present

## 2013-11-05 DIAGNOSIS — S61209A Unspecified open wound of unspecified finger without damage to nail, initial encounter: Secondary | ICD-10-CM | POA: Diagnosis not present

## 2014-01-25 ENCOUNTER — Other Ambulatory Visit (HOSPITAL_COMMUNITY): Payer: Self-pay | Admitting: Family Medicine

## 2014-01-25 ENCOUNTER — Ambulatory Visit (HOSPITAL_COMMUNITY)
Admission: RE | Admit: 2014-01-25 | Discharge: 2014-01-25 | Disposition: A | Discharge status: Home / Self Care | Payer: Medicare Other | Source: Ambulatory Visit | Attending: Family Medicine | Admitting: Family Medicine

## 2014-01-25 DIAGNOSIS — N509 Disorder of male genital organs, unspecified: Secondary | ICD-10-CM | POA: Insufficient documentation

## 2014-01-25 DIAGNOSIS — N508 Other specified disorders of male genital organs: Secondary | ICD-10-CM

## 2014-01-25 DIAGNOSIS — Z6825 Body mass index (BMI) 25.0-25.9, adult: Secondary | ICD-10-CM | POA: Diagnosis not present

## 2014-02-01 ENCOUNTER — Ambulatory Visit (INDEPENDENT_AMBULATORY_CARE_PROVIDER_SITE_OTHER): Payer: Medicare Other | Admitting: Urology

## 2014-02-01 DIAGNOSIS — N508 Other specified disorders of male genital organs: Secondary | ICD-10-CM | POA: Diagnosis not present

## 2014-02-01 DIAGNOSIS — N4340 Spermatocele of epididymis, unspecified: Secondary | ICD-10-CM

## 2014-02-05 ENCOUNTER — Other Ambulatory Visit: Payer: Self-pay | Admitting: Urology

## 2014-02-07 DIAGNOSIS — N508 Other specified disorders of male genital organs: Secondary | ICD-10-CM | POA: Diagnosis not present

## 2014-02-07 DIAGNOSIS — E119 Type 2 diabetes mellitus without complications: Secondary | ICD-10-CM | POA: Diagnosis not present

## 2014-02-07 DIAGNOSIS — IMO0002 Reserved for concepts with insufficient information to code with codable children: Secondary | ICD-10-CM | POA: Diagnosis not present

## 2014-02-07 DIAGNOSIS — I10 Essential (primary) hypertension: Secondary | ICD-10-CM | POA: Diagnosis not present

## 2014-02-14 ENCOUNTER — Encounter: Payer: Self-pay | Admitting: Cardiology

## 2014-02-14 ENCOUNTER — Ambulatory Visit (INDEPENDENT_AMBULATORY_CARE_PROVIDER_SITE_OTHER): Payer: Medicare Other | Admitting: Cardiology

## 2014-02-14 VITALS — BP 128/78 | HR 64 | Ht 70.0 in | Wt 167.0 lb

## 2014-02-14 DIAGNOSIS — I10 Essential (primary) hypertension: Secondary | ICD-10-CM

## 2014-02-14 DIAGNOSIS — Z8679 Personal history of other diseases of the circulatory system: Secondary | ICD-10-CM | POA: Diagnosis not present

## 2014-02-14 NOTE — Progress Notes (Signed)
Patient ID: Hunter Peterson, male   DOB: 1945-11-17, 68 y.o.   MRN: [id number]     Clinical Summary Mr. Obst is a 68 y.o.male seen today as a new patient. 68 y.o.    1. History of ventricular tachycardia - records not in system, requested from [facility] medical records - Patient admitted 09/1998 after developing VT during an exercise stress test. Patient required defibrillation. He had extensive cardiac workup including echo with normal LVEF, cath with patent coronaries, and EP study by Dr Lovena Le that was not able to induce the arrhythmia. He was started on beta blocker and has done well since. He reports one prior episode before 09/1998 where he felt heart racing and week, called EMS and was evaluated, apparently they were very concerned about his EKG at the time but it self resolved. He doesn't remember any other details.   - denies any symptoms on metoprolol. Though he often limits his exertion, mainly due to anxiety when his heart rate becomes elevated. Denies any DOE. Chops wood with axe is highest level of activity. Can walk up more than one flight of stairs without troubles.   - he is considering an elective urology procedure for a testicular mass that is causing him some discomfort. He is concerned about his risks regarding the surgery and anaesthesia.    Past Medical History  Diagnosis Date  . Diabetes mellitus without complication   . Dysrhythmia     tachycardia  . Hyperlipemia   . Enlarged prostate      No Known Allergies   Current Outpatient Prescriptions  Medication Sig Dispense Refill  . enalapril (VASOTEC) 2.5 MG tablet Take 2.5 mg by mouth daily.      . metFORMIN (GLUCOPHAGE) 500 MG tablet Take 500 mg by mouth 2 (two) times daily with a meal.      . metoprolol tartrate (LOPRESSOR) 25 MG tablet Take 25 mg by mouth 2 (two) times daily.      . simvastatin (ZOCOR) 40 MG tablet Take 40 mg by mouth daily.      . tamsulosin (FLOMAX) 0.4 MG CAPS capsule Take 0.4 mg by mouth daily.        No current facility-administered medications for this visit.     Past Surgical History  Procedure Laterality Date  . Tonsillectomy    . Colonoscopy N/A 10/02/2013    Procedure: COLONOSCOPY;  Surgeon: Jamesetta So, MD;  Location: [facility];  Service: Gastroenterology;  Laterality: N/A;     No Known Allergies    No family history on file.   Social History Mr. Santo reports that he has never smoked. He does not have any smokeless tobacco history on file. Mr. Bartosiewicz has no alcohol history on file.   Review of Systems CONSTITUTIONAL: No weight loss, fever, chills, weakness or fatigue.  HEENT: Eyes: No visual loss, blurred vision, double vision or yellow sclerae.No hearing loss, sneezing, congestion, runny nose or sore throat.  SKIN: No rash or itching.  CARDIOVASCULAR: per HPI RESPIRATORY: No shortness of breath, cough or sputum.  GASTROINTESTINAL: No anorexia, nausea, vomiting or diarrhea. No abdominal pain or blood.  GENITOURINARY: No burning on urination, no polyuria NEUROLOGICAL: No headache, dizziness, syncope, paralysis, ataxia, numbness or tingling in the extremities. No change in bowel or bladder control.  MUSCULOSKELETAL: No muscle, back pain, joint pain or stiffness.  LYMPHATICS: No enlarged nodes. No history of splenectomy.  PSYCHIATRIC: No history of depression or anxiety.  ENDOCRINOLOGIC: No reports of sweating, cold or heat intolerance.  No polyuria or polydipsia.  Marland Kitchen   Physical Examination p 64 bp 128/78 Wt 167 lbs BMI 24 Gen: resting comfortably, no acute distress HEENT: no scleral icterus, pupils equal round and reactive, no palptable cervical adenopathy,  CV: RRR, no m/r/g, no JVD, no carotid bruits Resp: Clear to auscultation bilaterally GI: abdomen is soft, non-tender, non-distended, normal bowel sounds, no hepatosplenomegaly MSK: extremities are warm, no edema.  Skin: warm, no rash Neuro:  no focal deficits Psych: appropriate  affect   Diagnostic Studies  02/14/14 Clinic EKG SR, RBBB   Assessment and Plan  1. Ventricular tachycardia - records obtained and will be scanned in, overall at the time of the event normal echo and cath, EP study unable to induce arrhythmia - he has done well on beta blockers, but limits his exertion mainly due to anxiety when he feels his heart rate increasing. - From standard cardiac surgical evaluation he tolerates greater than 4 METs without limitation without any known structural heart disease from prior studies.  - Regarding his specificic arrhythmia risk I am unclear. I will have the patient follow up with Dr Lovena Le who initially evaluated him and performed his EP study to get his thoughts.       Arnoldo Lenis, M.D.

## 2014-02-14 NOTE — Patient Instructions (Signed)
Your physician recommends that you schedule a follow-up appointment in: to be determined      Thank you for choosing [facility] !

## 2014-02-15 ENCOUNTER — Encounter: Payer: Self-pay | Admitting: Cardiology

## 2014-02-15 ENCOUNTER — Encounter (HOSPITAL_BASED_OUTPATIENT_CLINIC_OR_DEPARTMENT_OTHER): Payer: Self-pay | Admitting: *Deleted

## 2014-02-21 ENCOUNTER — Encounter (HOSPITAL_BASED_OUTPATIENT_CLINIC_OR_DEPARTMENT_OTHER): Admission: RE | Payer: Self-pay | Source: Ambulatory Visit

## 2014-02-21 ENCOUNTER — Ambulatory Visit (HOSPITAL_BASED_OUTPATIENT_CLINIC_OR_DEPARTMENT_OTHER): Admission: RE | Admit: 2014-02-21 | Payer: Medicare Other | Source: Ambulatory Visit | Admitting: Urology

## 2014-02-21 HISTORY — DX: Hyperlipidemia, unspecified: E78.5

## 2014-02-21 HISTORY — DX: Type 2 diabetes mellitus without complications: E11.9

## 2014-02-21 HISTORY — DX: Benign prostatic hyperplasia without lower urinary tract symptoms: N40.0

## 2014-02-21 HISTORY — DX: Spermatocele of epididymis, unspecified: N43.40

## 2014-02-21 SURGERY — EXCISION, SPERMATOCELE
Anesthesia: Choice | Laterality: Left

## 2014-03-13 ENCOUNTER — Encounter: Payer: Self-pay | Admitting: *Deleted

## 2014-03-14 ENCOUNTER — Encounter: Payer: Self-pay | Admitting: Internal Medicine

## 2014-03-14 ENCOUNTER — Ambulatory Visit (INDEPENDENT_AMBULATORY_CARE_PROVIDER_SITE_OTHER): Payer: Medicare Other | Admitting: Internal Medicine

## 2014-03-14 VITALS — BP 128/68 | HR 69 | Ht 70.0 in | Wt 178.0 lb

## 2014-03-14 DIAGNOSIS — I472 Ventricular tachycardia, unspecified: Secondary | ICD-10-CM

## 2014-03-14 DIAGNOSIS — I10 Essential (primary) hypertension: Secondary | ICD-10-CM | POA: Diagnosis not present

## 2014-03-14 NOTE — Assessment & Plan Note (Signed)
The patient has had no recurrent ventricular tachycardia. It is very unlikely that he will experience ventricular arrhythmias or for that matter any arrhythmias during pending urological surgery. He is low risk for surgery.

## 2014-03-14 NOTE — Progress Notes (Signed)
      HPI Mr. Hunter Peterson returns today after a 15 year absence from our arrhythmia clinic. He has a history of wide QRS tachycardia, for which she will underwent EP study in 2000, but had no inducible arrhythmias. Since then he has done well. He has rare palpitations, but on day of blocker therapy, and dietary restriction, he is managed to had no recurrent symptomatic tachycardia palpitations. His previous episodes of tachycardia all occurred during extreme exertion. He has learned not to undertake any extreme exertion. The patient is pending urologic surgery. He was concerned about developing problems during anesthesia, and returns today for additional valuation. He is fairly active. He denies chest pain or shortness of breath.  No Known Allergies   Current Outpatient Prescriptions  Medication Sig Dispense Refill  . enalapril (VASOTEC) 2.5 MG tablet Take 2.5 mg by mouth daily.      . metFORMIN (GLUCOPHAGE) 500 MG tablet Take 500 mg by mouth 2 (two) times daily with a meal.      . metoprolol tartrate (LOPRESSOR) 25 MG tablet Take 25 mg by mouth 2 (two) times daily.      . simvastatin (ZOCOR) 40 MG tablet Take 40 mg by mouth daily.      . tamsulosin (FLOMAX) 0.4 MG CAPS capsule Take 0.4 mg by mouth daily.       No current facility-administered medications for this visit.     Past Medical History  Diagnosis Date  . Dysrhythmia     tachycardia  . Spermatocele     LEFT  . Hyperlipidemia   . Type 2 diabetes mellitus   . BPH (benign prostatic hypertrophy)   . VT (ventricular tachycardia)     ROS:   All systems reviewed and negative except as noted in the HPI.   Past Surgical History  Procedure Laterality Date  . Tonsillectomy    . Colonoscopy N/A 10/02/2013    Procedure: COLONOSCOPY;  Surgeon: Jamesetta So, MD;  Location: [facility];  Service: Gastroenterology;  Laterality: N/A;     No family history on file.   History   Social History  . Marital Status: Married   Spouse Name: N/A    Number of Children: N/A  . Years of Education: N/A   Occupational History  . Not on file.   Social History Main Topics  . Smoking status: Never Smoker   . Smokeless tobacco: Not on file  . Alcohol Use: Not on file  . Drug Use: Not on file  . Sexual Activity: Not on file   Other Topics Concern  . Not on file   Social History Narrative  . No narrative on file     BP 128/68  Pulse 69  Ht 5\' 10"  (1.778 m)  Wt 178 lb (80.74 kg)  BMI 25.54 kg/m2  Physical Exam:  Well appearing NAD HEENT: Unremarkable Neck:  6 cm JVD, no thyromegally Back:  No CVA tenderness Lungs:  Clear with no wheezes, rales, or rhonchi.  HEART:  Regular rate rhythm, no murmurs, no rubs, no clicks Abd:  soft, positive bowel sounds, no organomegally, no rebound, no guarding Ext:  2 plus pulses, no edema, no cyanosis, no clubbing Skin:  No rashes no nodules Neuro:  CN II through XII intact, motor grossly intact  EKG - normal sinus rhythm    Assess/Plan:

## 2014-03-14 NOTE — Assessment & Plan Note (Signed)
His blood pressure is well controlled. No change in medical therapy. 

## 2014-03-14 NOTE — Progress Notes (Signed)
      HPI  No Known Allergies   Current Outpatient Prescriptions  Medication Sig Dispense Refill  . enalapril (VASOTEC) 2.5 MG tablet Take 2.5 mg by mouth daily.      . metFORMIN (GLUCOPHAGE) 500 MG tablet Take 500 mg by mouth 2 (two) times daily with a meal.      . metoprolol tartrate (LOPRESSOR) 25 MG tablet Take 25 mg by mouth 2 (two) times daily.      . simvastatin (ZOCOR) 40 MG tablet Take 40 mg by mouth daily.      . tamsulosin (FLOMAX) 0.4 MG CAPS capsule Take 0.4 mg by mouth daily.       No current facility-administered medications for this visit.     Past Medical History  Diagnosis Date  . Dysrhythmia     tachycardia  . Spermatocele     LEFT  . Hyperlipidemia   . Type 2 diabetes mellitus   . BPH (benign prostatic hypertrophy)   . VT (ventricular tachycardia)     ROS:   All systems reviewed and negative except as noted in the HPI.   Past Surgical History  Procedure Laterality Date  . Tonsillectomy    . Colonoscopy N/A 10/02/2013    Procedure: COLONOSCOPY;  Surgeon: Jamesetta So, MD;  Location: [facility];  Service: Gastroenterology;  Laterality: N/A;     No family history on file.   History   Social History  . Marital Status: Married    Spouse Name: N/A    Number of Children: N/A  . Years of Education: N/A   Occupational History  . Not on file.   Social History Main Topics  . Smoking status: Never Smoker   . Smokeless tobacco: Not on file  . Alcohol Use: Not on file  . Drug Use: Not on file  . Sexual Activity: Not on file   Other Topics Concern  . Not on file   Social History Narrative  . No narrative on file     BP 128/68  Pulse 69  Ht 5\' 10"  (1.778 m)  Wt 178 lb (80.74 kg)  BMI 25.54 kg/m2  Physical Exam:  Well appearing NAD HEENT: Unremarkable Neck:  No JVD, no thyromegally Lymphatics:  No adenopathy Back:  No CVA tenderness Lungs:  Clear HEART:  Regular rate rhythm, no murmurs, no rubs, no clicks Abd:  soft,  positive bowel sounds, no organomegally, no rebound, no guarding Ext:  2 plus pulses, no edema, no cyanosis, no clubbing Skin:  No rashes no nodules Neuro:  CN II through XII intact, motor grossly intact  EKG  DEVICE  Normal device function.  See PaceArt for details.   Assess/Plan:

## 2014-03-14 NOTE — Patient Instructions (Signed)
Your physician recommends that you schedule a follow-up appointment as needed with Dr Taylor  

## 2014-03-15 ENCOUNTER — Other Ambulatory Visit: Payer: Self-pay | Admitting: Urology

## 2014-03-15 DIAGNOSIS — E782 Mixed hyperlipidemia: Secondary | ICD-10-CM | POA: Diagnosis not present

## 2014-03-15 DIAGNOSIS — Z23 Encounter for immunization: Secondary | ICD-10-CM | POA: Diagnosis not present

## 2014-03-15 DIAGNOSIS — E119 Type 2 diabetes mellitus without complications: Secondary | ICD-10-CM | POA: Diagnosis not present

## 2014-03-15 DIAGNOSIS — I472 Ventricular tachycardia: Secondary | ICD-10-CM | POA: Diagnosis not present

## 2014-03-15 DIAGNOSIS — Z6824 Body mass index (BMI) 24.0-24.9, adult: Secondary | ICD-10-CM | POA: Diagnosis not present

## 2014-03-15 DIAGNOSIS — I10 Essential (primary) hypertension: Secondary | ICD-10-CM | POA: Diagnosis not present

## 2014-03-25 ENCOUNTER — Encounter (HOSPITAL_COMMUNITY): Payer: Self-pay | Admitting: *Deleted

## 2014-03-26 ENCOUNTER — Encounter (HOSPITAL_COMMUNITY): Payer: Self-pay | Admitting: *Deleted

## 2014-03-26 NOTE — Progress Notes (Signed)
NPO AFTER MN. ARRIVE AT 0600. NEEDS ISTAT. CURRENT EKG IN CHART AND EPIC. WILL TAKE METOPROLOL, FLOMAX, AND ZOCOR AM DOS W/ SIPS OF WATER.

## 2014-03-27 NOTE — H&P (Signed)
Active Problems  1. Spermatocele (608.1)  History of Present Illness  Hunter Peterson is a 68 y o WM sent in consultation by Dr. Hilma Favors.  He has had some pain in his left groin since April and then noticed a swelling the the scrotum abotu 4-5 weeks ago that was tender.  He saw Dr. Hilma Favors and was found on [place] to have a large left epididymal cyst.  He is voiding ok but is on tamsulosin for BPH with BOO.  He has some frequency, urgency and nocturia.  He has had no hematuria or dysuria.  He has had no GU surgery, UTI's or stones.   Past Medical History  1. History of atrial fibrillation (V12.59)  2. History of diabetes mellitus (V12.29)  Surgical History  1. History of Tonsillectomy  Current Meds  1. Enalapril Maleate 2.5 MG Oral Tablet;  Therapy: (Recorded:18Sep2015) to Recorded  2. MetFORMIN HCl - 500 MG Oral Tablet;  Therapy: (Recorded:18Sep2015) to Recorded  3. Metoprolol Tartrate 25 MG Oral Tablet;  Therapy: (Recorded:18Sep2015) to Recorded  4. Simvastatin 40 MG Oral Tablet;  Therapy: (Recorded:18Sep2015) to Recorded  5. Tamsulosin HCl - 0.4 MG Oral Capsule;  Therapy: (Recorded:18Sep2015) to Recorded  Allergies  1. No Known Drug Allergies  Family History  1. Family history of Death of family member : Mother  Social History   Caffeine use (V49.89)   3/day   Married   Never smoker   Never used tobacco (V49.89)   Number of children   2 daughters   Retired  Review of Systems Genitourinary, constitutional, skin, eye, otolaryngeal, hematologic/lymphatic, cardiovascular, pulmonary, endocrine, musculoskeletal, gastrointestinal, neurological and psychiatric system(s) were reviewed and pertinent findings if present are noted.  Genitourinary: urinary frequency, feelings of urinary urgency, nocturia, erectile dysfunction and scrotal pain.    Vitals Vital Signs [Data Includes: Last 1 Day]  Recorded: 18Sep2015 02:19PM  Height: 5 ft 10 in Weight: 183 lb  BMI Calculated:  26.26 BSA Calculated: 2.01 Blood Pressure: 130 / 80 Temperature: 98.2 F Heart Rate: 85  Physical Exam Constitutional: Well nourished and well developed . No acute distress.  ENT:. The ears and nose are normal in appearance.  Neck: The appearance of the neck is normal and no neck mass is present.  Pulmonary: No respiratory distress and normal respiratory rhythm and effort.  Cardiovascular: Heart rate and rhythm are normal . No peripheral edema.  Abdomen: The abdomen is soft and nontender. No masses are palpated. No CVA tenderness. No hernias are palpable. No hepatosplenomegaly noted.  Genitourinary: Examination of the penis demonstrates no discharge, no masses, no lesions and a normal meatus. The scrotum is without lesions. The right epididymis is palpably normal and non-tender. The left epididymis is tender and found to have a 3 cm spermatocele, but palpably normal. The right testis is non-tender and without masses. The left testis is non-tender and without masses.  Lymphatics: The femoral and inguinal nodes are not enlarged or tender.  Skin: Normal skin turgor, no visible rash and no visible skin lesions.  Neuro/Psych:. Mood and affect are appropriate.    Results/Data Urine [Data Includes: Last 1 Day]   [id number]  COLOR YELLOW   APPEARANCE CLEAR   SPECIFIC GRAVITY 1.015   pH 6.5   GLUCOSE NEG mg/dL  BILIRUBIN NEG   KETONE NEG mg/dL  BLOOD NEG   PROTEIN NEG mg/dL  UROBILINOGEN 1 mg/dL  NITRITE NEG   LEUKOCYTE ESTERASE NEG    Old records or history reviewed: I have reviewed the records from  Dr. Hilma Favors.  The following clinical lab reports were reviewed:  UA reviewed.  The following radiology reports were reviewed: I have reviewed the scrotal US results.    Assessment  1. Spermatocele (608.1)   He has a symptomatic left spermatocele.   Plan Scrotal mass   1. UA With REFLEX; [Do Not Release]; Status:Resulted - Requires Verification;   Done:  [id number] 02:31PM Spermatocele    2. Follow-up Schedule Surgery Office  Follow-up  Status: Hold For - Appointment   Requested for: [phone]    I discussed the options for treatment and will get him set up for a left spermatocelectomy.  I reviewed the risks of bleeding, infection, recurrence, testicular injury, chronic pain, recurrent cysts, thrombotic events, testicular atrophy and anesthetic complications.   Discussion/Summary  CC: Dr. Sharilyn Sites.

## 2014-03-27 NOTE — Anesthesia Preprocedure Evaluation (Addendum)
Anesthesia Evaluation  Patient identified by MRN, date of birth, ID band Patient awake    Reviewed: Allergy & Precautions, H&P , NPO status , Patient's Chart, lab work & pertinent test results  History of Anesthesia Complications Negative for: history of anesthetic complications  Airway Mallampati: III  TM Distance: >3 FB Neck ROM: Full    Dental no notable dental hx. (+) Poor Dentition, Dental Advisory Given, Caps,    Pulmonary neg pulmonary ROS,  breath sounds clear to auscultation  Pulmonary exam normal       Cardiovascular hypertension, Pt. on medications and Pt. on home beta blockers negative cardio ROS  + dysrhythmias (hx of wide complex VTACH requiring defibrillation in 2000, full cardiac work up and started on metoprolol, no problems since) Rhythm:Regular Rate:Normal  Hx of wide complex VTACH during exercise stress testing, followed by cardiology, on metoprolol which he took this am, cardiology clearance for surgery and considered low risk for arrythmia during procedure per note, not been able to reproduce or ablate this, able to do strenuous exercise such as chopping wood but can feel when his heart rate starts to increase and will rest from activity to prevent this arrythmia.    Neuro/Psych negative neurological ROS  negative psych ROS   GI/Hepatic negative GI ROS, Neg liver ROS,   Endo/Other  negative endocrine ROSdiabetes, Type 2, Oral Hypoglycemic Agents  Renal/GU negative Renal ROS  negative genitourinary   Musculoskeletal negative musculoskeletal ROS (+)   Abdominal   Peds negative pediatric ROS (+)  Hematology negative hematology ROS (+)   Anesthesia Other Findings   Reproductive/Obstetrics negative OB ROS                          Anesthesia Physical Anesthesia Plan  ASA: II  Anesthesia Plan: General   Post-op Pain Management:    Induction: Intravenous  Airway  Management Planned: LMA  Additional Equipment:   Intra-op Plan:   Post-operative Plan: Extubation in OR  Informed Consent: I have reviewed the patients History and Physical, chart, labs and discussed the procedure including the risks, benefits and alternatives for the proposed anesthesia with the patient or authorized representative who has indicated his/her understanding and acceptance.   Dental advisory given  Plan Discussed with: CRNA  Anesthesia Plan Comments:         Anesthesia Quick Evaluation

## 2014-03-28 ENCOUNTER — Encounter (HOSPITAL_BASED_OUTPATIENT_CLINIC_OR_DEPARTMENT_OTHER): Payer: Self-pay | Admitting: *Deleted

## 2014-03-28 ENCOUNTER — Ambulatory Visit (HOSPITAL_COMMUNITY)
Admission: RE | Admit: 2014-03-28 | Discharge: 2014-03-28 | Disposition: A | Discharge status: Home / Self Care | Payer: Medicare Other | Source: Ambulatory Visit | Attending: Urology | Admitting: Urology

## 2014-03-28 ENCOUNTER — Encounter (HOSPITAL_BASED_OUTPATIENT_CLINIC_OR_DEPARTMENT_OTHER)
Admission: RE | Disposition: A | Discharge status: Home / Self Care | Payer: Self-pay | Source: Ambulatory Visit | Attending: Urology

## 2014-03-28 ENCOUNTER — Ambulatory Visit (HOSPITAL_BASED_OUTPATIENT_CLINIC_OR_DEPARTMENT_OTHER): Payer: Medicare Other | Admitting: Anesthesiology

## 2014-03-28 DIAGNOSIS — N4341 Spermatocele of epididymis, single: Secondary | ICD-10-CM | POA: Insufficient documentation

## 2014-03-28 DIAGNOSIS — I10 Essential (primary) hypertension: Secondary | ICD-10-CM | POA: Diagnosis not present

## 2014-03-28 DIAGNOSIS — E119 Type 2 diabetes mellitus without complications: Secondary | ICD-10-CM | POA: Insufficient documentation

## 2014-03-28 DIAGNOSIS — N400 Enlarged prostate without lower urinary tract symptoms: Secondary | ICD-10-CM | POA: Insufficient documentation

## 2014-03-28 DIAGNOSIS — N503 Cyst of epididymis: Secondary | ICD-10-CM | POA: Diagnosis present

## 2014-03-28 DIAGNOSIS — I4891 Unspecified atrial fibrillation: Secondary | ICD-10-CM | POA: Diagnosis not present

## 2014-03-28 DIAGNOSIS — N4340 Spermatocele of epididymis, unspecified: Secondary | ICD-10-CM | POA: Diagnosis not present

## 2014-03-28 DIAGNOSIS — I472 Ventricular tachycardia: Secondary | ICD-10-CM | POA: Diagnosis not present

## 2014-03-28 DIAGNOSIS — Z95810 Presence of automatic (implantable) cardiac defibrillator: Secondary | ICD-10-CM | POA: Insufficient documentation

## 2014-03-28 HISTORY — DX: Unspecified right bundle-branch block: I45.10

## 2014-03-28 HISTORY — DX: Personal history of colonic polyps: Z86.010

## 2014-03-28 HISTORY — DX: Personal history of adenomatous and serrated colon polyps: Z86.0101

## 2014-03-28 HISTORY — DX: Personal history of other diseases of the circulatory system: Z86.79

## 2014-03-28 HISTORY — PX: SPERMATOCELECTOMY: SHX2420

## 2014-03-28 HISTORY — DX: Tachycardia, unspecified: R00.0

## 2014-03-28 LAB — GLUCOSE, CAPILLARY: Glucose-Capillary: 146 mg/dL — ABNORMAL HIGH (ref 70–99)

## 2014-03-28 LAB — POCT I-STAT 4, (NA,K, GLUC, HGB,HCT)
GLUCOSE: 127 mg/dL — AB (ref 70–99)
HCT: 47 % (ref 39.0–52.0)
Hemoglobin: 16 g/dL (ref 13.0–17.0)
POTASSIUM: 3.9 meq/L (ref 3.7–5.3)
Sodium: 143 mEq/L (ref 137–147)

## 2014-03-28 SURGERY — EXCISION, SPERMATOCELE
Anesthesia: General | Site: Scrotum | Laterality: Left

## 2014-03-28 MED ORDER — FENTANYL CITRATE 0.05 MG/ML IJ SOLN
25.0000 ug | INTRAMUSCULAR | Status: DC | PRN
Start: 2014-03-28 — End: 2014-03-28
  Filled 2014-03-28: qty 1

## 2014-03-28 MED ORDER — BUPIVACAINE HCL (PF) 0.25 % IJ SOLN
INTRAMUSCULAR | Status: DC | PRN
Start: 2014-03-28 — End: 2014-03-28
  Administered 2014-03-28: 11 mL

## 2014-03-28 MED ORDER — MIDAZOLAM HCL 2 MG/2ML IJ SOLN
INTRAMUSCULAR | Status: AC
  Filled 2014-03-28: qty 2

## 2014-03-28 MED ORDER — DEXAMETHASONE SODIUM PHOSPHATE 4 MG/ML IJ SOLN
INTRAMUSCULAR | Status: DC | PRN
  Administered 2014-03-28: 10 mg via INTRAVENOUS

## 2014-03-28 MED ORDER — SODIUM CHLORIDE 0.9 % IR SOLN
Status: DC | PRN
  Administered 2014-03-28: 500 mL

## 2014-03-28 MED ORDER — FENTANYL CITRATE 0.05 MG/ML IJ SOLN
INTRAMUSCULAR | Status: AC
  Filled 2014-03-28: qty 2

## 2014-03-28 MED ORDER — LACTATED RINGERS IV SOLN
INTRAVENOUS | Status: DC
  Administered 2014-03-28 (×2): via INTRAVENOUS
  Filled 2014-03-28: qty 1000

## 2014-03-28 MED ORDER — EPHEDRINE SULFATE 50 MG/ML IJ SOLN
INTRAMUSCULAR | Status: DC | PRN
  Administered 2014-03-28 (×2): 10 mg via INTRAVENOUS

## 2014-03-28 MED ORDER — LIDOCAINE HCL (CARDIAC) 20 MG/ML IV SOLN
INTRAVENOUS | Status: DC | PRN
  Administered 2014-03-28: 80 mg via INTRAVENOUS

## 2014-03-28 MED ORDER — ONDANSETRON HCL 4 MG/2ML IJ SOLN
4.0000 mg | Freq: Once | INTRAMUSCULAR | Status: DC | PRN
  Filled 2014-03-28: qty 2

## 2014-03-28 MED ORDER — HYDROCODONE-ACETAMINOPHEN 5-325 MG PO TABS
1.0000 | ORAL_TABLET | Freq: Four times a day (QID) | ORAL | Status: DC | PRN
  Administered 2014-03-28: 1 via ORAL
  Filled 2014-03-28: qty 1

## 2014-03-28 MED ORDER — CEFAZOLIN SODIUM-DEXTROSE 2-3 GM-% IV SOLR
2.0000 g | INTRAVENOUS | Status: AC
  Administered 2014-03-28: 2 g via INTRAVENOUS
  Filled 2014-03-28: qty 50

## 2014-03-28 MED ORDER — FENTANYL CITRATE 0.05 MG/ML IJ SOLN
INTRAMUSCULAR | Status: DC | PRN
  Administered 2014-03-28: 50 ug via INTRAVENOUS

## 2014-03-28 MED ORDER — HYDROCODONE-ACETAMINOPHEN 5-325 MG PO TABS
ORAL_TABLET | ORAL | Status: AC
  Filled 2014-03-28: qty 1

## 2014-03-28 MED ORDER — CEFAZOLIN SODIUM-DEXTROSE 2-3 GM-% IV SOLR
INTRAVENOUS | Status: AC
  Filled 2014-03-28: qty 50

## 2014-03-28 MED ORDER — HYDROCODONE-ACETAMINOPHEN 5-325 MG PO TABS: 1.0000 | ORAL_TABLET | Freq: Four times a day (QID) | ORAL | Status: DC | PRN

## 2014-03-28 MED ORDER — MIDAZOLAM HCL 5 MG/5ML IJ SOLN
INTRAMUSCULAR | Status: DC | PRN
  Administered 2014-03-28: 2 mg via INTRAVENOUS

## 2014-03-28 MED ORDER — ONDANSETRON HCL 4 MG/2ML IJ SOLN
INTRAMUSCULAR | Status: DC | PRN
Start: 2014-03-28 — End: 2014-03-28
  Administered 2014-03-28: 4 mg via INTRAVENOUS

## 2014-03-28 MED ORDER — PROPOFOL 10 MG/ML IV BOLUS
INTRAVENOUS | Status: DC | PRN
  Administered 2014-03-28: 150 mg via INTRAVENOUS

## 2014-03-28 SURGICAL SUPPLY — 42 items
APPLICATOR COTTON TIP 6IN STRL (MISCELLANEOUS) IMPLANT
BLADE SURG 15 STRL LF DISP TIS (BLADE) ×1 IMPLANT
BLADE SURG 15 STRL SS (BLADE) ×2
BNDG GAUZE ELAST 4 BULKY (GAUZE/BANDAGES/DRESSINGS) ×3 IMPLANT
CANISTER SUCTION 1200CC (MISCELLANEOUS) IMPLANT
CANISTER SUCTION 2500CC (MISCELLANEOUS) IMPLANT
CLEANER CAUTERY TIP 5X5 PAD (MISCELLANEOUS) ×1 IMPLANT
CLOTH BEACON ORANGE TIMEOUT ST (SAFETY) ×3 IMPLANT
COVER MAYO STAND STRL (DRAPES) ×3 IMPLANT
COVER TABLE BACK 60X90 (DRAPES) ×3 IMPLANT
DISSECTOR ROUND CHERRY 3/8 STR (MISCELLANEOUS) IMPLANT
DRAIN PENROSE 18X1/4 LTX STRL (WOUND CARE) IMPLANT
DRAPE PED LAPAROTOMY (DRAPES) ×3 IMPLANT
ELECT REM PT RETURN 9FT ADLT (ELECTROSURGICAL) ×3
ELECTRODE REM PT RTRN 9FT ADLT (ELECTROSURGICAL) ×1 IMPLANT
GLOVE BIO SURGEON STRL SZ 6.5 (GLOVE) ×2 IMPLANT
GLOVE BIO SURGEON STRL SZ7.5 (GLOVE) ×3 IMPLANT
GLOVE BIO SURGEONS STRL SZ 6.5 (GLOVE) ×1
GLOVE BIOGEL PI IND STRL 6.5 (GLOVE) ×2 IMPLANT
GLOVE BIOGEL PI INDICATOR 6.5 (GLOVE) ×4
GLOVE SURG SS PI 8.0 STRL IVOR (GLOVE) ×3 IMPLANT
GOWN STRL REUS W/ TWL LRG LVL3 (GOWN DISPOSABLE) ×1 IMPLANT
GOWN STRL REUS W/ TWL XL LVL3 (GOWN DISPOSABLE) ×2 IMPLANT
GOWN STRL REUS W/TWL LRG LVL3 (GOWN DISPOSABLE) ×2
GOWN STRL REUS W/TWL XL LVL3 (GOWN DISPOSABLE) ×4
NEEDLE HYPO 22GX1.5 SAFETY (NEEDLE) ×3 IMPLANT
NS IRRIG 500ML POUR BTL (IV SOLUTION) ×3 IMPLANT
PACK BASIN DAY SURGERY FS (CUSTOM PROCEDURE TRAY) ×3 IMPLANT
PAD CLEANER CAUTERY TIP 5X5 (MISCELLANEOUS) ×2
PENCIL BUTTON HOLSTER BLD 10FT (ELECTRODE) ×3 IMPLANT
SPONGE GAUZE 4X4 12PLY STER LF (GAUZE/BANDAGES/DRESSINGS) ×3 IMPLANT
SUPPORT SCROTAL LG STRP (MISCELLANEOUS) ×2 IMPLANT
SUPPORTER ATHLETIC LG (MISCELLANEOUS) ×1
SUT CHROMIC 3 0 SH 27 (SUTURE) ×9 IMPLANT
SUT VICRYL 0 TIES 12 18 (SUTURE) ×3 IMPLANT
SYR BULB IRRIGATION 50ML (SYRINGE) ×3 IMPLANT
SYRINGE CONTROL L 12CC (SYRINGE) ×3 IMPLANT
TRAY DSU PREP LF (CUSTOM PROCEDURE TRAY) ×3 IMPLANT
TUBE CONNECTING 12'X1/4 (SUCTIONS) ×1
TUBE CONNECTING 12X1/4 (SUCTIONS) ×2 IMPLANT
WATER STERILE IRR 500ML POUR (IV SOLUTION) IMPLANT
YANKAUER SUCT BULB TIP NO VENT (SUCTIONS) ×3 IMPLANT

## 2014-03-28 NOTE — Interval H&P Note (Signed)
History and Physical Interval Note:  He reports no change in the left scrotal swelling.   03/28/2014 7:08 AM  [place]  has presented today for surgery, with the diagnosis of LEFT SPERMATOCELE   The various methods of treatment have been discussed with the patient and family. After consideration of risks, benefits and other options for treatment, the patient has consented to  Procedure(s): LEFT SPERMATOCELECTOMY (Left) as a surgical intervention .  The patient's history has been reviewed, patient examined, no change in status, stable for surgery.  I have reviewed the patient's chart and labs.  Questions were answered to the patient's satisfaction.     Hunter Peterson

## 2014-03-28 NOTE — Anesthesia Procedure Notes (Signed)
Procedure Name: LMA Insertion Date/Time: 03/28/2014 7:37 AM Performed by: Wanita Chamberlain Pre-anesthesia Checklist: Patient identified, Emergency Drugs available, Suction available, Patient being monitored and Timeout performed Patient Re-evaluated:Patient Re-evaluated prior to inductionOxygen Delivery Method: Circle system utilized Preoxygenation: Pre-oxygenation with 100% oxygen Intubation Type: IV induction Ventilation: Mask ventilation without difficulty Number of attempts: 1 Airway Equipment and Method: Bite block Placement Confirmation: positive ETCO2 Tube secured with: Tape Dental Injury: Teeth and Oropharynx as per pre-operative assessment

## 2014-03-28 NOTE — Transfer of Care (Signed)
Immediate Anesthesia Transfer of Care Note  Patient: Hunter Peterson  Procedure(s) Performed: Procedure(s): LEFT SPERMATOCELECTOMY (Left)  Patient Location: PACU  Anesthesia Type:General  Level of Consciousness: awake, alert , oriented and patient cooperative  Airway & Oxygen Therapy: Patient Spontanous Breathing and Patient connected to nasal cannula oxygen  Post-op Assessment: Report given to PACU RN and Post -op Vital signs reviewed and stable  Post vital signs: Reviewed and stable  Complications: No apparent anesthesia complications

## 2014-03-28 NOTE — Discharge Instructions (Addendum)
Discharge instructions following scrotal surgery  Call your doctor for:  Fever is greater than 100.5  Severe nausea or vomiting  Increasing pain not controlled by pain medication  Increasing redness or drainage from incisions  The number for questions or concerns is [phone]  Activity level: No lifting greater than 10 pounds (about equal to milk) for the next 2 weeks or until cleared to do so at follow-up appointment.  Otherwise activity as tolerated by comfort level.  Diet: May resume your regular diet as tolerated  Driving: No driving while still taking opiate pain medications (weight at least 6-8 hours after last dose).  No driving if you still sore from surgery as it may limit her ability to react quickly if necessary.   Shower/bath: May shower and get incision wet pad dry immediately following.  Do not scrub vigorously for the next 2-3 weeks.  Do not soak incision (ID soaking in bath or swimming) until told he may do so by Dr. Jeffie Pollock, as this may promote a wound infection.  Wound care: He may cover wounds with sterile gauze as needed to prevent incisions rubbing on close follow-up in any seepage.  Where tight fitting underpants for at least 2 weeks.  He should apply cold compresses (ice or sac of frozen peas/corn) to your scrotum for at least 48 hours to reduce the swelling.  You should expect that his scrotum will swell up initially and then get smaller over the next 2-4 weeks.  Follow-up appointments: Your follow up has already been scheduled with Dr. Jeffie Pollock.  Please call his office for details if needed.   Post Anesthesia Home Care Instructions  Activity: Get plenty of rest for the remainder of the day. A responsible adult should stay with you for 24 hours following the procedure.  For the next 24 hours, DO NOT: -Drive a car -Paediatric nurse -Drink alcoholic beverages -Take any medication unless instructed by your physician -Make any legal decisions or sign  important papers.  Meals: Start with liquid foods such as gelatin or soup. Progress to regular foods as tolerated. Avoid greasy, spicy, heavy foods. If nausea and/or vomiting occur, drink only clear liquids until the nausea and/or vomiting subsides. Call your physician if vomiting continues.  Special Instructions/Symptoms: Your throat may feel dry or sore from the anesthesia or the breathing tube placed in your throat during surgery. If this causes discomfort, gargle with warm salt water. The discomfort should disappear within 24 hours.

## 2014-03-28 NOTE — Anesthesia Postprocedure Evaluation (Signed)
  Anesthesia Post-op Note  Patient: Hunter Peterson  Procedure(s) Performed: Procedure(s) (LRB): LEFT SPERMATOCELECTOMY (Left)  Patient Location: PACU  Anesthesia Type: General  Level of Consciousness: awake and alert   Airway and Oxygen Therapy: Patient Spontanous Breathing  Post-op Pain: mild  Post-op Assessment: Post-op Vital signs reviewed, Patient's Cardiovascular Status Stable, Respiratory Function Stable, Patent Airway and No signs of Nausea or vomiting  Last Vitals:  Filed Vitals:   03/28/14 0900  BP: 122/72  Pulse: 85  Temp:   Resp: 24    Post-op Vital Signs: stable   Complications: No apparent anesthesia complications

## 2014-03-28 NOTE — Op Note (Signed)
Preoperative diagnosis:  1. Left epididymal head cyst  Postoperative diagnosis: 1. Same  Procedure(s): 1. Excision of left epididymal head cyst  Surgeon: Irine Seal, MD  Resident: Langley Adie, MD  Anesthesia: General   Complications: None apparent  EBL: Minimal  Specimens: None  Intraoperative findings: Left epididymal head cyst  Indication: 68 yo with symptomatic left epididymal head cyst  Description of procedure:  Patient was induced with general anesthesia in the supine position. Scrotum was clipped and prepped and draped in the usual sterile fashion. Peri-operative antibiotics were provided. Final timeout called and information confirmed correct. Began by making a horizontal scrotal incision along left hemiscrotum. The left testicle with ~3cm epididymal head cyst was delivered using a combination of sharp and electrocautery dissection. The cyst was dissected down to its attachment to the epididymis and then completely dissected free taking a portion of the head of the epididymis in the process. The testicle appeared normal and viable at the end of the procedure. Hemostasis was confirmed and testicle returned to scrotum in the normal anatomic position.   Dartos was closed with running chromic suture and the skin closed with running chromic as well.  Dressing was applied. Patient awoken and taken to PACU for recovery.

## 2014-03-29 ENCOUNTER — Encounter (HOSPITAL_BASED_OUTPATIENT_CLINIC_OR_DEPARTMENT_OTHER): Payer: Self-pay | Admitting: Urology

## 2014-04-26 ENCOUNTER — Ambulatory Visit (INDEPENDENT_AMBULATORY_CARE_PROVIDER_SITE_OTHER): Payer: Self-pay | Admitting: Urology

## 2014-04-26 DIAGNOSIS — N4340 Spermatocele of epididymis, unspecified: Secondary | ICD-10-CM

## 2014-05-02 IMAGING — CT CT ABD-PELV W/ CM
2 of 4 series · 16 of 46 positions shown, 18 images · IV contrast (Omnipaque 300)
Comparison: No priors.

CLINICAL DATA: Left lower quadrant pain for the past month.

EXAM:
CT ABDOMEN AND PELVIS WITH CONTRAST
TECHNIQUE: Multidetector CT imaging of the abdomen and pelvis was performed
using the standard protocol following bolus administration of
intravenous contrast.
CONTRAST:  100mL OMNIPAQUE IOHEXOL 300 MG/ML  SOLN

[Series 2: abd_pel_with 5.0 b40f · axial · 0.83mm/px · z∈[-423,-3]mm · 13 of 94 slices shown, 15 images]
[im 5/94  soft-tissue]
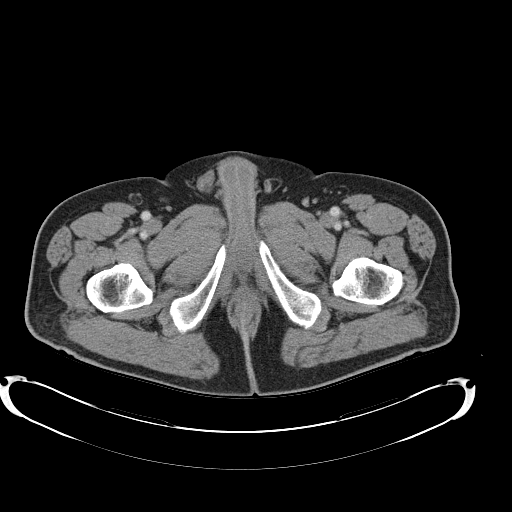
[im 5/94  bone]
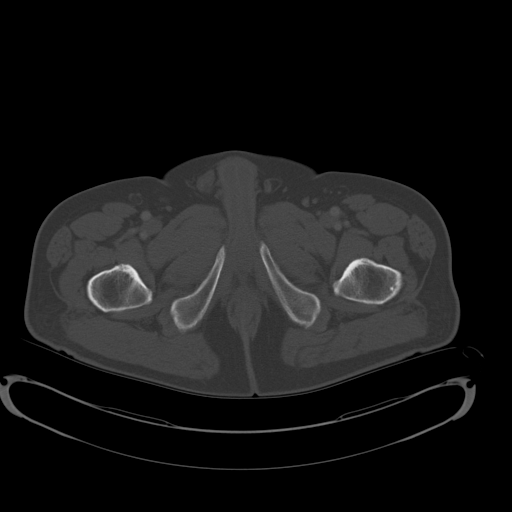
[im 14/94  soft-tissue]
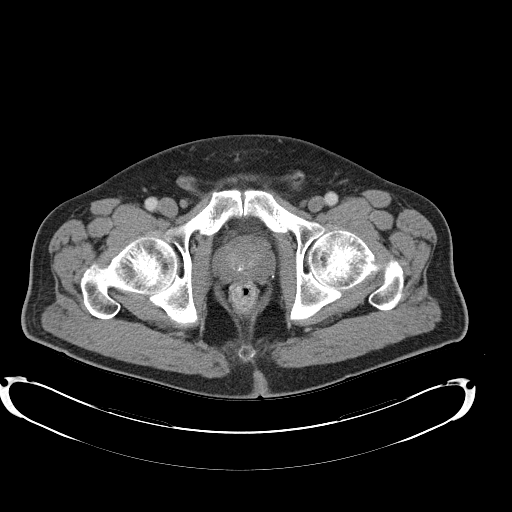
[im 18/94  soft-tissue]
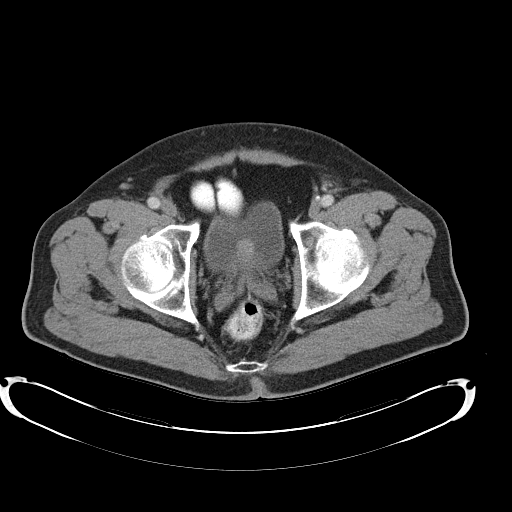
[im 27/94  soft-tissue]
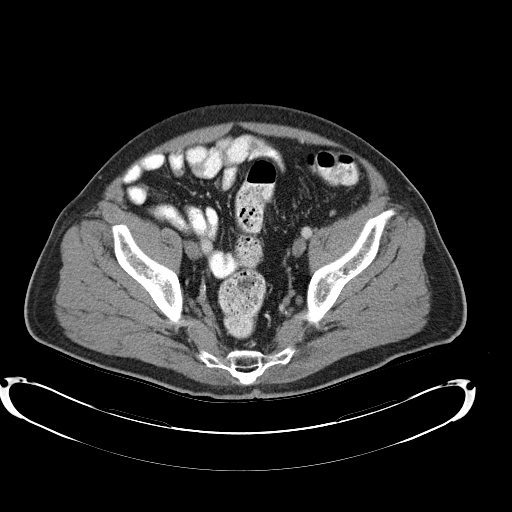
[im 32/94  soft-tissue]
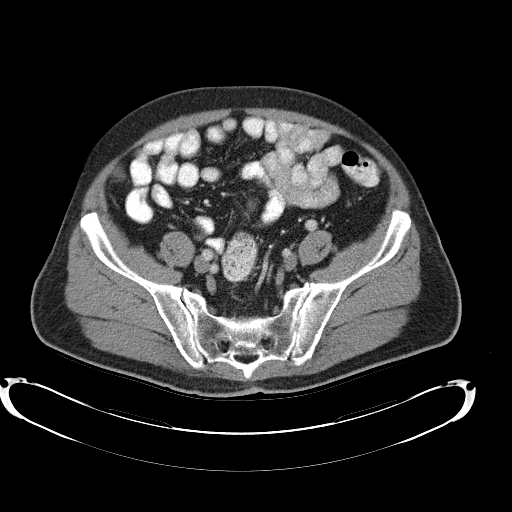
[im 40/94  soft-tissue]
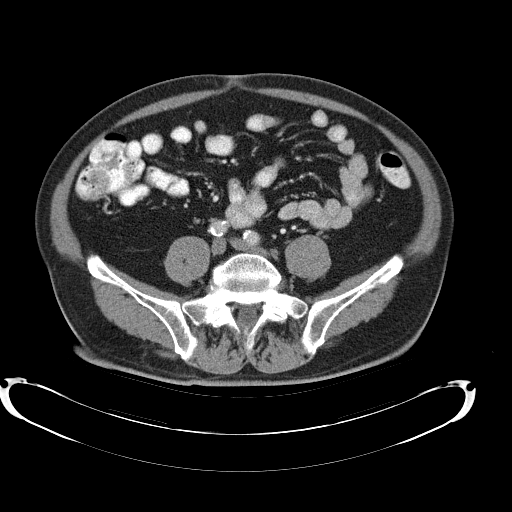
[im 49/94  soft-tissue]
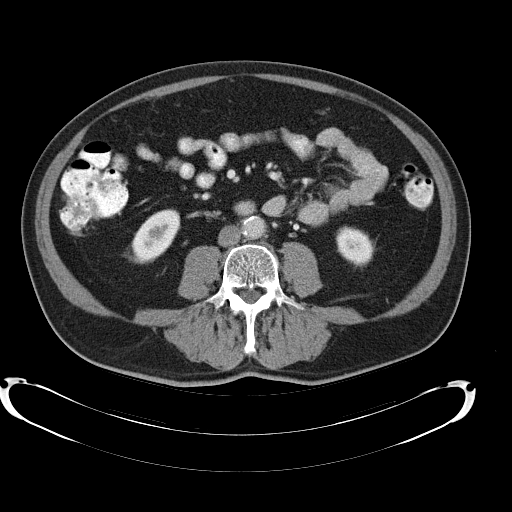
[im 54/94  soft-tissue]
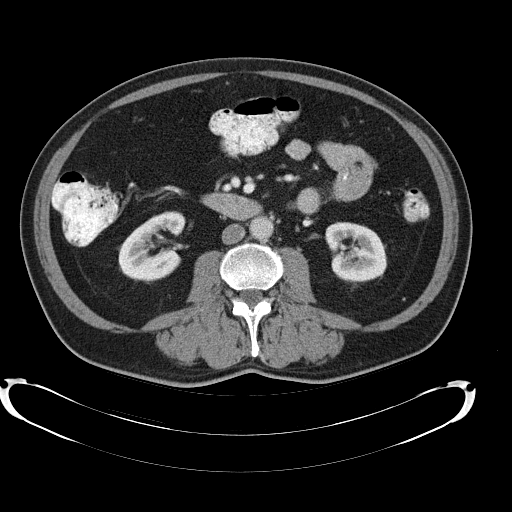
[im 63/94  soft-tissue]
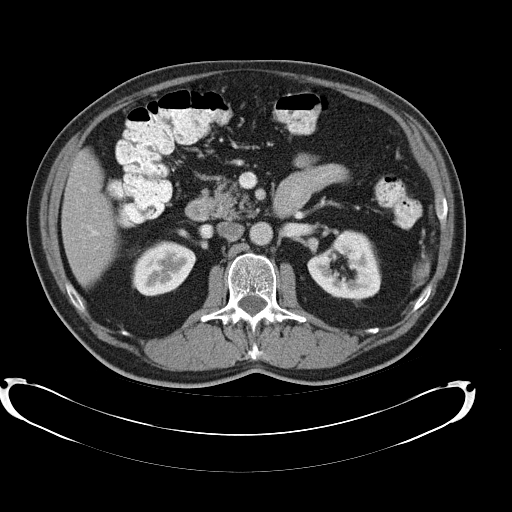
[im 63/94  bone]
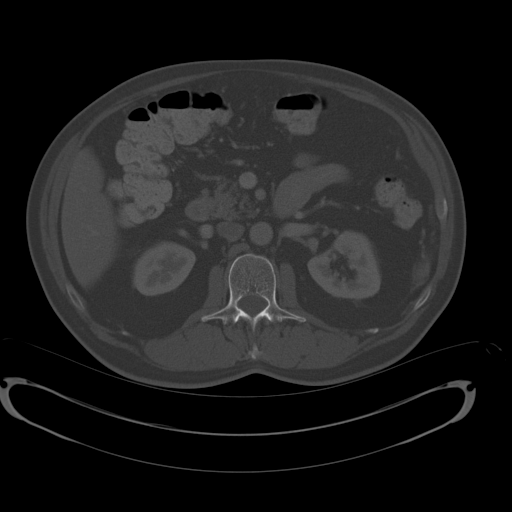
[im 67/94  soft-tissue]
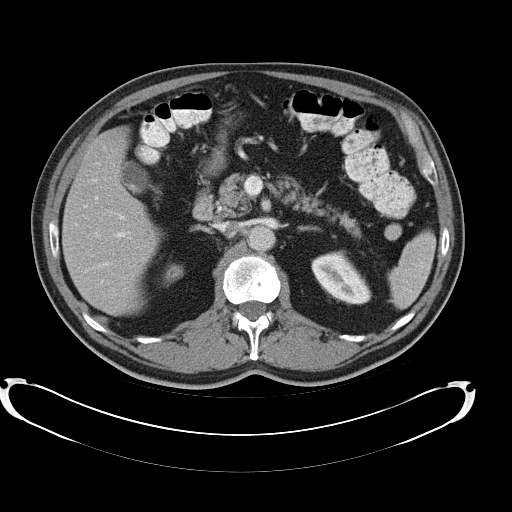
[im 76/94  soft-tissue]
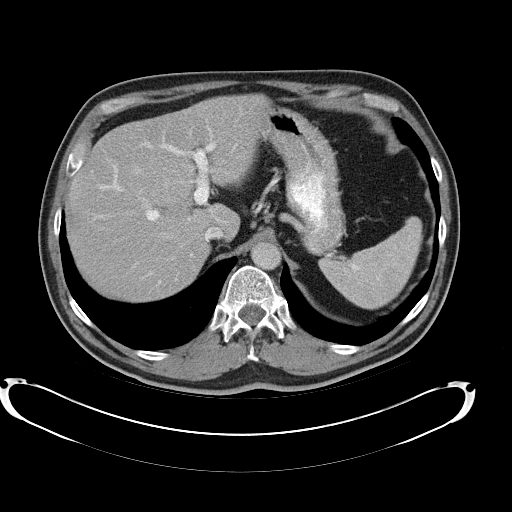
[im 80/94  soft-tissue]
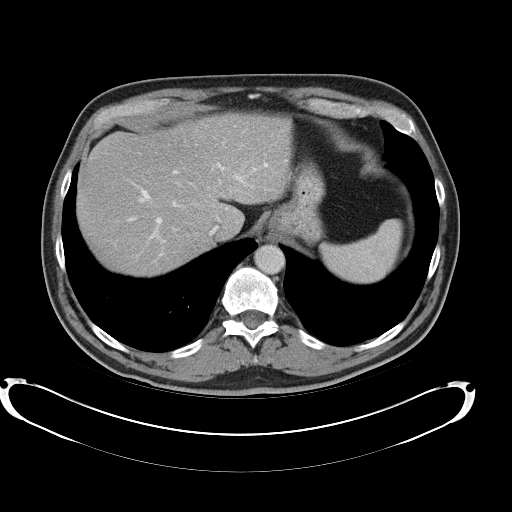
[im 89/94  soft-tissue]
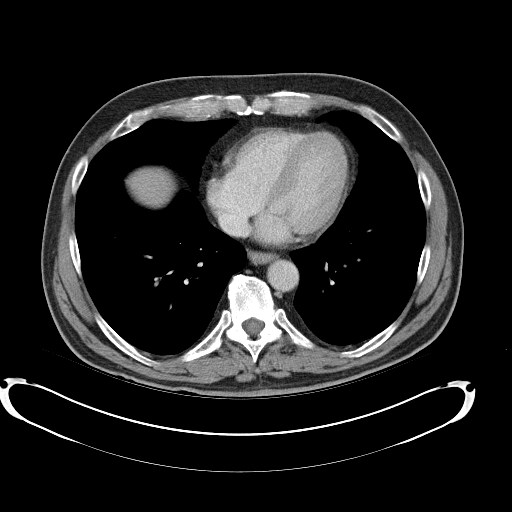

[Series 4: abd_pel_with 3.0 spo cor · coronal · 0.80mm/px · 3 of 89 slices shown]
[im 30/89  soft-tissue]
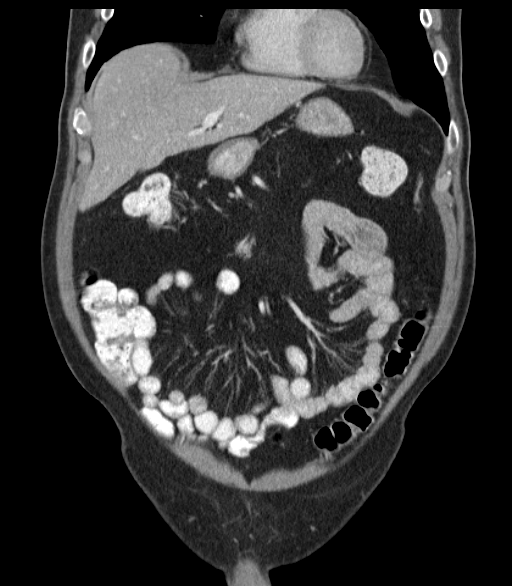
[im 40/89  soft-tissue]
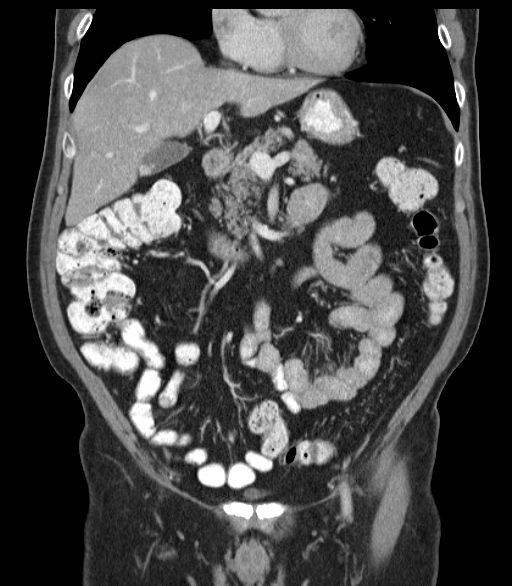
[im 49/89  soft-tissue]
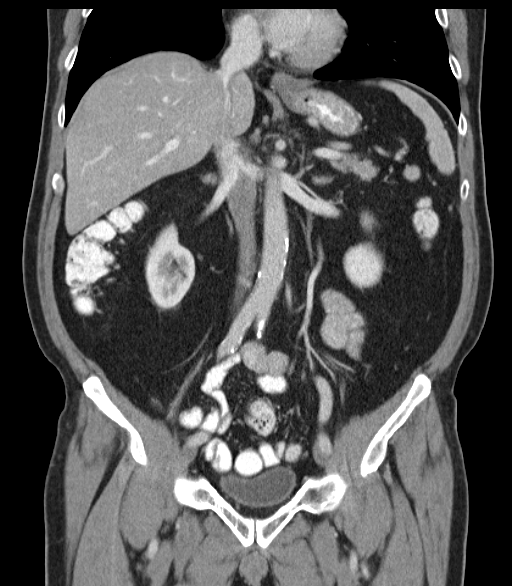

[16 of 46 positions shown; findings below may reference images not displayed]

FINDINGS: Lung Bases: Unremarkable.

Abdomen/Pelvis: Several tiny high attenuation foci lying dependently
within the gallbladder are compatible with tiny gallstones. No
current findings to suggest acute cholecystitis at this time. The
appearance of the liver, pancreas, spleen, bilateral adrenal glands
and the left kidney is unremarkable. Several tiny subcentimeter low
attenuation lesions in the right kidney are too small to
definitively characterize, but are statistically likely to represent
tiny cysts. Atherosclerosis in the abdominal and pelvic vasculature,
without evidence of aneurysm or dissection.

No significant volume of ascites. No pneumoperitoneum. No pathologic
distention of small bowel. No lymphadenopathy identified within the
abdomen or pelvis. Prostate gland is remarkable for mild median lobe
hypertrophy. Urinary bladder is unremarkable in appearance.

Musculoskeletal: There are no aggressive appearing lytic or blastic
lesions noted in the visualized portions of the skeleton.
IMPRESSION: 1. No acute findings to account for the patient's symptoms.
Specifically, no evidence of significant colonic diverticulosis and
no findings of acute diverticulitis at this time.
2. Cholelithiasis without findings to suggest acute cholecystitis at
this time.
3. Atherosclerosis.

## 2014-06-21 DIAGNOSIS — R350 Frequency of micturition: Secondary | ICD-10-CM | POA: Diagnosis not present

## 2014-06-21 DIAGNOSIS — Z6824 Body mass index (BMI) 24.0-24.9, adult: Secondary | ICD-10-CM | POA: Diagnosis not present

## 2014-06-21 DIAGNOSIS — N419 Inflammatory disease of prostate, unspecified: Secondary | ICD-10-CM | POA: Diagnosis not present

## 2014-09-17 DIAGNOSIS — E782 Mixed hyperlipidemia: Secondary | ICD-10-CM | POA: Diagnosis not present

## 2014-09-17 DIAGNOSIS — Z6824 Body mass index (BMI) 24.0-24.9, adult: Secondary | ICD-10-CM | POA: Diagnosis not present

## 2014-09-17 DIAGNOSIS — E119 Type 2 diabetes mellitus without complications: Secondary | ICD-10-CM | POA: Diagnosis not present

## 2014-09-17 DIAGNOSIS — I10 Essential (primary) hypertension: Secondary | ICD-10-CM | POA: Diagnosis not present

## 2014-10-22 DIAGNOSIS — H40033 Anatomical narrow angle, bilateral: Secondary | ICD-10-CM | POA: Diagnosis not present

## 2014-10-22 DIAGNOSIS — E119 Type 2 diabetes mellitus without complications: Secondary | ICD-10-CM | POA: Diagnosis not present

## 2014-12-05 DIAGNOSIS — Z6827 Body mass index (BMI) 27.0-27.9, adult: Secondary | ICD-10-CM | POA: Diagnosis not present

## 2014-12-05 DIAGNOSIS — Z0000 Encounter for general adult medical examination without abnormal findings: Secondary | ICD-10-CM | POA: Diagnosis not present

## 2014-12-05 DIAGNOSIS — E663 Overweight: Secondary | ICD-10-CM | POA: Diagnosis not present

## 2014-12-05 DIAGNOSIS — Z1389 Encounter for screening for other disorder: Secondary | ICD-10-CM | POA: Diagnosis not present

## 2015-03-04 DIAGNOSIS — I10 Essential (primary) hypertension: Secondary | ICD-10-CM | POA: Diagnosis not present

## 2015-03-04 DIAGNOSIS — Z23 Encounter for immunization: Secondary | ICD-10-CM | POA: Diagnosis not present

## 2015-03-04 DIAGNOSIS — Z1389 Encounter for screening for other disorder: Secondary | ICD-10-CM | POA: Diagnosis not present

## 2015-03-04 DIAGNOSIS — E782 Mixed hyperlipidemia: Secondary | ICD-10-CM | POA: Diagnosis not present

## 2015-03-04 DIAGNOSIS — Z6825 Body mass index (BMI) 25.0-25.9, adult: Secondary | ICD-10-CM | POA: Diagnosis not present

## 2015-03-04 DIAGNOSIS — G4709 Other insomnia: Secondary | ICD-10-CM | POA: Diagnosis not present

## 2015-03-04 DIAGNOSIS — E119 Type 2 diabetes mellitus without complications: Secondary | ICD-10-CM | POA: Diagnosis not present

## 2015-03-17 ENCOUNTER — Other Ambulatory Visit (HOSPITAL_COMMUNITY): Payer: Self-pay | Admitting: Family Medicine

## 2015-03-17 ENCOUNTER — Ambulatory Visit (HOSPITAL_COMMUNITY)
Admission: RE | Admit: 2015-03-17 | Discharge: 2015-03-17 | Disposition: A | Discharge status: Home / Self Care | Payer: Medicare Other | Source: Ambulatory Visit | Attending: Family Medicine | Admitting: Family Medicine

## 2015-03-17 DIAGNOSIS — K8020 Calculus of gallbladder without cholecystitis without obstruction: Secondary | ICD-10-CM | POA: Insufficient documentation

## 2015-03-17 DIAGNOSIS — K760 Fatty (change of) liver, not elsewhere classified: Secondary | ICD-10-CM | POA: Diagnosis not present

## 2015-03-17 DIAGNOSIS — E663 Overweight: Secondary | ICD-10-CM | POA: Diagnosis not present

## 2015-03-17 DIAGNOSIS — R5383 Other fatigue: Secondary | ICD-10-CM | POA: Diagnosis not present

## 2015-03-17 DIAGNOSIS — R109 Unspecified abdominal pain: Secondary | ICD-10-CM | POA: Diagnosis not present

## 2015-03-17 DIAGNOSIS — I700 Atherosclerosis of aorta: Secondary | ICD-10-CM | POA: Diagnosis not present

## 2015-03-17 DIAGNOSIS — Z23 Encounter for immunization: Secondary | ICD-10-CM | POA: Diagnosis not present

## 2015-03-17 DIAGNOSIS — R630 Anorexia: Secondary | ICD-10-CM | POA: Diagnosis not present

## 2015-03-17 DIAGNOSIS — Z6825 Body mass index (BMI) 25.0-25.9, adult: Secondary | ICD-10-CM | POA: Diagnosis not present

## 2015-03-17 DIAGNOSIS — Z1389 Encounter for screening for other disorder: Secondary | ICD-10-CM | POA: Diagnosis not present

## 2015-03-17 DIAGNOSIS — R140 Abdominal distension (gaseous): Secondary | ICD-10-CM | POA: Diagnosis not present

## 2015-03-17 DIAGNOSIS — Z125 Encounter for screening for malignant neoplasm of prostate: Secondary | ICD-10-CM | POA: Diagnosis not present

## 2015-03-27 ENCOUNTER — Encounter (HOSPITAL_COMMUNITY)
Admission: RE | Admit: 2015-03-27 | Discharge: 2015-03-27 | Disposition: A | Discharge status: Home / Self Care | Payer: Medicare Other | Source: Ambulatory Visit | Attending: General Surgery | Admitting: General Surgery

## 2015-03-27 ENCOUNTER — Encounter (HOSPITAL_COMMUNITY): Payer: Self-pay

## 2015-03-27 DIAGNOSIS — K8020 Calculus of gallbladder without cholecystitis without obstruction: Secondary | ICD-10-CM | POA: Insufficient documentation

## 2015-03-27 DIAGNOSIS — Z01818 Encounter for other preprocedural examination: Secondary | ICD-10-CM | POA: Diagnosis not present

## 2015-03-27 HISTORY — DX: Personal history of other diseases of the circulatory system: Z86.79

## 2015-03-27 LAB — CBC WITH DIFFERENTIAL/PLATELET
Basophils Absolute: 0.1 10*3/uL (ref 0.0–0.1)
Basophils Relative: 1 %
EOS ABS: 0.3 10*3/uL (ref 0.0–0.7)
Eosinophils Relative: 4 %
HCT: 44.7 % (ref 39.0–52.0)
HEMOGLOBIN: 15 g/dL (ref 13.0–17.0)
Lymphocytes Relative: 26 %
Lymphs Abs: 1.8 10*3/uL (ref 0.7–4.0)
MCH: 31.1 pg (ref 26.0–34.0)
MCHC: 33.6 g/dL (ref 30.0–36.0)
MCV: 92.7 fL (ref 78.0–100.0)
Monocytes Absolute: 0.7 10*3/uL (ref 0.1–1.0)
Monocytes Relative: 10 %
Neutro Abs: 4.1 10*3/uL (ref 1.7–7.7)
Neutrophils Relative %: 59 %
PLATELETS: 205 10*3/uL (ref 150–400)
RBC: 4.82 MIL/uL (ref 4.22–5.81)
RDW: 12.7 % (ref 11.5–15.5)
WBC: 7 10*3/uL (ref 4.0–10.5)

## 2015-03-27 LAB — BASIC METABOLIC PANEL
Anion gap: 8 (ref 5–15)
BUN: 16 mg/dL (ref 6–20)
CHLORIDE: 107 mmol/L (ref 101–111)
CO2: 25 mmol/L (ref 22–32)
CREATININE: 1.04 mg/dL (ref 0.61–1.24)
Calcium: 9.3 mg/dL (ref 8.9–10.3)
GFR calc Af Amer: >60 mL/min (ref >60)
GFR calc non Af Amer: >60 mL/min (ref >60)
Glucose, Bld: 100 mg/dL — ABNORMAL HIGH (ref 65–99)
Potassium: 4.6 mmol/L (ref 3.5–5.1)
Sodium: 140 mmol/L (ref 135–145)

## 2015-03-27 LAB — HEPATIC FUNCTION PANEL
ALBUMIN: 4.4 g/dL (ref 3.5–5.0)
ALK PHOS: 57 U/L (ref 38–126)
ALT: 42 U/L (ref 17–63)
AST: 23 U/L (ref 15–41)
Bilirubin, Direct: 0.3 mg/dL (ref 0.1–0.5)
Indirect Bilirubin: 1 mg/dL — ABNORMAL HIGH (ref 0.3–0.9)
Total Bilirubin: 1.3 mg/dL — ABNORMAL HIGH (ref 0.3–1.2)
Total Protein: 7.1 g/dL (ref 6.5–8.1)

## 2015-03-27 NOTE — Patient Instructions (Signed)
Hunter Peterson  03/27/2015     @PREFPERIOPPHARMACY @   Your procedure is scheduled on 03/31/2015.  Report to [facility] at 7:00 A.M.  Call this number if you have problems the morning of surgery:  [phone]   Remember:  Do not eat food or drink liquids after midnight.  Take these medicines the morning of surgery with A SIP OF WATER:  Vasotec, Lopressor and Flomax   Do not wear jewelry, make-up or nail polish.  Do not wear lotions, powders, or perfumes.  You may wear deodorant.  Do not shave 48 hours prior to surgery.  Men may shave face and neck.  Do not bring valuables to the hospital.  [facility] is not responsible for any belongings or valuables.  Contacts, dentures or bridgework may not be worn into surgery.  Leave your suitcase in the car.  After surgery it may be brought to your room.  For patients admitted to the hospital, discharge time will be determined by your treatment team.  Patients discharged the day of surgery will not be allowed to drive home.   Name and phone number of your driver:   family Special instructions:  n/a  Please read over the following fact sheets that you were given. Care and Recovery After Surgery    Laparoscopic Cholecystectomy Laparoscopic cholecystectomy is surgery to remove the gallbladder. The gallbladder is located in the upper right part of the abdomen, behind the liver. It is a storage sac for bile, which is produced in the liver. Bile aids in the digestion and absorption of fats. Cholecystectomy is often done for inflammation of the gallbladder (cholecystitis). This condition is usually caused by a buildup of gallstones (cholelithiasis) in the gallbladder. Gallstones can block the flow of bile, and that can result in inflammation and pain. In severe cases, emergency surgery may be required. If emergency surgery is not required, you will have time to prepare for the procedure. Laparoscopic surgery is an alternative to open  surgery. Laparoscopic surgery has a shorter recovery time. Your common bile duct may also need to be examined during the procedure. If stones are found in the common bile duct, they may be removed. LET [facility] CARE PROVIDER KNOW ABOUT:  Any allergies you have.  All medicines you are taking, including vitamins, herbs, eye drops, creams, and over-the-counter medicines.  Previous problems you or members of your family have had with the use of anesthetics.  Any blood disorders you have.  Previous surgeries you have had.  Any medical conditions you have. RISKS AND COMPLICATIONS Generally, this is a safe procedure. However, problems may occur, including:  Infection.  Bleeding.  Allergic reactions to medicines.  Damage to other structures or organs.  A stone remaining in the common bile duct.  A bile leak from the cyst duct that is clipped when your gallbladder is removed.  The need to convert to open surgery, which requires a larger incision in the abdomen. This may be necessary if your surgeon thinks that it is not safe to continue with a laparoscopic procedure. BEFORE THE PROCEDURE  Ask your health care provider about:  Changing or stopping your regular medicines. This is especially important if you are taking diabetes medicines or blood thinners.  Taking medicines such as aspirin and ibuprofen. These medicines can thin your blood. Do not take these medicines before your procedure if your health care provider instructs you not to.  Follow instructions from your health care provider about eating or drinking  restrictions.  Let your health care provider know if you develop a cold or an infection before surgery.  Plan to have someone take you home after the procedure.  Ask your health care provider how your surgical site will be marked or identified.  You may be given antibiotic medicine to help prevent infection. PROCEDURE  To reduce your risk of infection:  Your  health care team will wash or sanitize their hands.  Your skin will be washed with soap.  An IV tube may be inserted into one of your veins.  You will be given a medicine to make you fall asleep (general anesthetic).  A breathing tube will be placed in your mouth.  The surgeon will make several small cuts (incisions) in your abdomen.  A thin, lighted tube (laparoscope) that has a tiny camera on the end will be inserted through one of the small incisions. The camera on the laparoscope will send a picture to a TV screen (monitor) in the operating room. This will give the surgeon a good view inside your abdomen.  A gas will be pumped into your abdomen. This will expand your abdomen to give the surgeon more room to perform the surgery.  Other tools that are needed for the procedure will be inserted through the other incisions. The gallbladder will be removed through one of the incisions.  After your gallbladder has been removed, the incisions will be closed with stitches (sutures), staples, or skin glue.  Your incisions may be covered with a bandage (dressing). The procedure may vary among health care providers and hospitals. AFTER THE PROCEDURE  Your blood pressure, heart rate, breathing rate, and blood oxygen level will be monitored often until the medicines you were given have worn off.  You will be given medicines as needed to control your pain.   This information is not intended to replace advice given to you by your health care provider. Make sure you discuss any questions you have with your health care provider.   Document Released: 05/03/2005 Document Revised: 01/22/2015 Document Reviewed: 12/13/2012 Elsevier Interactive Patient Education [employer].

## 2015-03-28 NOTE — H&P (Signed)
  NTS SOAP Note  Vital Signs:  Vitals as of: [id number]: Systolic 123XX123: Diastolic 80: Heart Rate 74: Temp 98.7F: Height 89ft 10in: Weight 172Lbs 0 Ounces: BMI 24.68  BMI : 24.68 kg/m2  Subjective: This 69 year old male presents for of gallstones.  Recently had right upper quadrant abdominal pain, nausea, and bloating.  Does not seem to be going away.  Made worse with fatty foods.  U/S of gallbladder showed choleltihiasis, normal common bile duct.  Review of Symptoms:  Constitutional:unremarkable   Head:unremarkable Eyes:unremarkable   Nose/Mouth/Throat:unremarkable Cardiovascular:  unremarkable Respiratory:unremarkable Gastrointestindyspepsia Genitourinary:unremarkable   Musculoskeletal:unremarkable Skin:unremarkable Hematolgic/Lymphatic:unremarkable   Allergic/Immunologic:unremarkable   Past Medical History:  Reviewed  Past Medical History  Surgical History: unremarkable Medical Problems: NIDDM, HTN, high cholesterol levels Allergies: nkda Medications: metformin, simvastatin, enalapril, tamsulosin   Social History:Reviewed  Social History  Preferred Language: English Race:  White Ethnicity: Not Hispanic / Latino Age: 59 Years 5 Months Marital Status:  M Alcohol: unknown   Smoking Status: Never smoker reviewed on 03/27/2015 Functional Status reviewed on 03/27/2015 ------------------------------------------------ Bathing: Normal Cooking: Normal Dressing: Normal Driving: Normal Eating: Normal Managing Meds: Normal Oral Care: Normal Shopping: Normal Toileting: Normal Transferring: Normal Walking: Normal Cognitive Status reviewed on 03/27/2015 ------------------------------------------------ Attention: Normal Decision Making: Normal Language: Normal Memory: Normal Motor: Normal Perception: Normal Problem Solving: Normal Visual and Spatial: Normal   Family History:Reviewed  Family Health History Mother, Deceased; Healthy;   Father, Deceased; Healthy;     Objective Information: General:Well appearing, well nourished in no distress. Heart:RRR, no murmur or gallop.  Normal S1, S2.  No S3, S4.  Lungs:  CTA bilaterally, no wheezes, rhonchi, rales.  Breathing unlabored. Abdomen:Soft, NT/ND, no HSM, no masses.  Assessment:cholelithiasis  Diagnoses: 574.20  K80.20 Gallstone (Calculus of gallbladder without cholecystitis without obstruction)  Procedures: [id number] - OFFICE OUTPATIENT VISIT 25 MINUTES    Plan:  Scheduled for laparoscopic cholecystectomy on 03/31/15.   Patient Education:Alternative treatments to surgery were discussed with patient (and family).  Risks and benefits  of procedure including bleeding, infection, hepatobiliary injury, and the possibility of an open procedure were fully explained to the patient (and family) who gave informed consent. Patient/family questions were addressed.  Follow-up:Pending Surgery

## 2015-03-31 ENCOUNTER — Encounter (HOSPITAL_COMMUNITY)
Admission: RE | Disposition: A | Discharge status: Home / Self Care | Payer: Self-pay | Source: Ambulatory Visit | Attending: General Surgery

## 2015-03-31 ENCOUNTER — Ambulatory Visit (HOSPITAL_COMMUNITY): Payer: Medicare Other | Admitting: Anesthesiology

## 2015-03-31 ENCOUNTER — Encounter (HOSPITAL_COMMUNITY): Payer: Self-pay | Admitting: *Deleted

## 2015-03-31 ENCOUNTER — Ambulatory Visit (HOSPITAL_COMMUNITY)
Admission: RE | Admit: 2015-03-31 | Discharge: 2015-03-31 | Disposition: A | Discharge status: Home / Self Care | Payer: Medicare Other | Source: Ambulatory Visit | Attending: General Surgery | Admitting: General Surgery

## 2015-03-31 DIAGNOSIS — K8020 Calculus of gallbladder without cholecystitis without obstruction: Secondary | ICD-10-CM | POA: Insufficient documentation

## 2015-03-31 DIAGNOSIS — K8010 Calculus of gallbladder with chronic cholecystitis without obstruction: Secondary | ICD-10-CM | POA: Diagnosis not present

## 2015-03-31 DIAGNOSIS — I10 Essential (primary) hypertension: Secondary | ICD-10-CM | POA: Insufficient documentation

## 2015-03-31 DIAGNOSIS — E7800 Pure hypercholesterolemia, unspecified: Secondary | ICD-10-CM | POA: Diagnosis not present

## 2015-03-31 DIAGNOSIS — E119 Type 2 diabetes mellitus without complications: Secondary | ICD-10-CM | POA: Insufficient documentation

## 2015-03-31 DIAGNOSIS — Z79899 Other long term (current) drug therapy: Secondary | ICD-10-CM | POA: Insufficient documentation

## 2015-03-31 DIAGNOSIS — Z7984 Long term (current) use of oral hypoglycemic drugs: Secondary | ICD-10-CM | POA: Diagnosis not present

## 2015-03-31 HISTORY — PX: CHOLECYSTECTOMY: SHX55

## 2015-03-31 LAB — GLUCOSE, CAPILLARY
GLUCOSE-CAPILLARY: 101 mg/dL — AB (ref 65–99)
Glucose-Capillary: 211 mg/dL — ABNORMAL HIGH (ref 65–99)

## 2015-03-31 SURGERY — LAPAROSCOPIC CHOLECYSTECTOMY
Anesthesia: General | Site: Abdomen

## 2015-03-31 MED ORDER — SUCCINYLCHOLINE CHLORIDE 20 MG/ML IJ SOLN
INTRAMUSCULAR | Status: DC | PRN
  Administered 2015-03-31: 120 mg via INTRAVENOUS

## 2015-03-31 MED ORDER — MIDAZOLAM HCL 2 MG/2ML IJ SOLN
INTRAMUSCULAR | Status: AC
  Filled 2015-03-31: qty 2

## 2015-03-31 MED ORDER — LACTATED RINGERS IV SOLN
INTRAVENOUS | Status: DC
  Administered 2015-03-31: 1000 mL via INTRAVENOUS
  Administered 2015-03-31: 08:00:00 via INTRAVENOUS

## 2015-03-31 MED ORDER — ROCURONIUM BROMIDE 50 MG/5ML IV SOLN
INTRAVENOUS | Status: AC
  Filled 2015-03-31: qty 1

## 2015-03-31 MED ORDER — PROPOFOL 10 MG/ML IV BOLUS
INTRAVENOUS | Status: AC
  Filled 2015-03-31: qty 20

## 2015-03-31 MED ORDER — LIDOCAINE HCL (CARDIAC) 20 MG/ML IV SOLN
INTRAVENOUS | Status: DC | PRN
  Administered 2015-03-31: 25 mg via INTRAVENOUS

## 2015-03-31 MED ORDER — HYDROCODONE-ACETAMINOPHEN 5-325 MG PO TABS: 1.0000 | ORAL_TABLET | ORAL | Status: AC | PRN

## 2015-03-31 MED ORDER — CIPROFLOXACIN IN D5W 400 MG/200ML IV SOLN
INTRAVENOUS | Status: AC
  Filled 2015-03-31: qty 200

## 2015-03-31 MED ORDER — SUCCINYLCHOLINE CHLORIDE 20 MG/ML IJ SOLN
INTRAMUSCULAR | Status: AC
  Filled 2015-03-31: qty 1

## 2015-03-31 MED ORDER — FENTANYL CITRATE (PF) 250 MCG/5ML IJ SOLN
INTRAMUSCULAR | Status: AC
  Filled 2015-03-31: qty 25

## 2015-03-31 MED ORDER — MIDAZOLAM HCL 2 MG/2ML IJ SOLN
1.0000 mg | INTRAMUSCULAR | Status: DC | PRN
  Administered 2015-03-31: 2 mg via INTRAVENOUS

## 2015-03-31 MED ORDER — SODIUM CHLORIDE 0.9 % IR SOLN
Status: DC | PRN
  Administered 2015-03-31: 1000 mL

## 2015-03-31 MED ORDER — EPHEDRINE SULFATE 50 MG/ML IJ SOLN
INTRAMUSCULAR | Status: DC | PRN
Start: 2015-03-31 — End: 2015-03-31
  Administered 2015-03-31: 10 mg via INTRAVENOUS

## 2015-03-31 MED ORDER — SODIUM CHLORIDE 0.9 % IJ SOLN
INTRAMUSCULAR | Status: AC
  Filled 2015-03-31: qty 10

## 2015-03-31 MED ORDER — CIPROFLOXACIN IN D5W 400 MG/200ML IV SOLN
400.0000 mg | INTRAVENOUS | Status: AC
  Administered 2015-03-31: 400 mg via INTRAVENOUS

## 2015-03-31 MED ORDER — FENTANYL CITRATE (PF) 100 MCG/2ML IJ SOLN
INTRAMUSCULAR | Status: DC | PRN
  Administered 2015-03-31 (×2): 50 ug via INTRAVENOUS
  Administered 2015-03-31: 25 ug via INTRAVENOUS
  Administered 2015-03-31 (×2): 50 ug via INTRAVENOUS
  Administered 2015-03-31: 25 ug via INTRAVENOUS

## 2015-03-31 MED ORDER — BUPIVACAINE HCL (PF) 0.5 % IJ SOLN
INTRAMUSCULAR | Status: AC
  Filled 2015-03-31: qty 30

## 2015-03-31 MED ORDER — EPHEDRINE SULFATE 50 MG/ML IJ SOLN
INTRAMUSCULAR | Status: AC
  Filled 2015-03-31: qty 1

## 2015-03-31 MED ORDER — NEOSTIGMINE METHYLSULFATE 10 MG/10ML IV SOLN
INTRAVENOUS | Status: DC | PRN
Start: 2015-03-31 — End: 2015-03-31
  Administered 2015-03-31: 4 mg via INTRAVENOUS

## 2015-03-31 MED ORDER — FENTANYL CITRATE (PF) 100 MCG/2ML IJ SOLN
25.0000 ug | INTRAMUSCULAR | Status: DC | PRN
  Administered 2015-03-31 (×2): 50 ug via INTRAVENOUS
  Filled 2015-03-31: qty 2

## 2015-03-31 MED ORDER — LIDOCAINE HCL (PF) 1 % IJ SOLN
INTRAMUSCULAR | Status: AC
  Filled 2015-03-31: qty 5

## 2015-03-31 MED ORDER — PROPOFOL 10 MG/ML IV BOLUS
INTRAVENOUS | Status: DC | PRN
  Administered 2015-03-31: 140 mg via INTRAVENOUS

## 2015-03-31 MED ORDER — ROCURONIUM BROMIDE 100 MG/10ML IV SOLN
INTRAVENOUS | Status: DC | PRN
  Administered 2015-03-31: 5 mg via INTRAVENOUS
  Administered 2015-03-31: 20 mg via INTRAVENOUS
  Administered 2015-03-31: 10 mg via INTRAVENOUS

## 2015-03-31 MED ORDER — GLYCOPYRROLATE 0.2 MG/ML IJ SOLN
INTRAMUSCULAR | Status: DC | PRN
  Administered 2015-03-31: 0.2 mg via INTRAVENOUS
  Administered 2015-03-31: 0.6 mg via INTRAVENOUS

## 2015-03-31 MED ORDER — KETOROLAC TROMETHAMINE 30 MG/ML IJ SOLN
30.0000 mg | Freq: Once | INTRAMUSCULAR | Status: AC
  Administered 2015-03-31: 30 mg via INTRAVENOUS
  Filled 2015-03-31: qty 1

## 2015-03-31 MED ORDER — GLYCOPYRROLATE 0.2 MG/ML IJ SOLN
INTRAMUSCULAR | Status: AC
  Filled 2015-03-31: qty 1

## 2015-03-31 MED ORDER — POVIDONE-IODINE 10 % EX OINT
TOPICAL_OINTMENT | CUTANEOUS | Status: AC
  Filled 2015-03-31: qty 1

## 2015-03-31 MED ORDER — ONDANSETRON HCL 4 MG/2ML IJ SOLN: 4.0000 mg | Freq: Once | INTRAMUSCULAR | Status: DC | PRN

## 2015-03-31 MED ORDER — BUPIVACAINE HCL (PF) 0.5 % IJ SOLN
INTRAMUSCULAR | Status: DC | PRN
  Administered 2015-03-31: 10 mL

## 2015-03-31 MED ORDER — CHLORHEXIDINE GLUCONATE 4 % EX LIQD: 1.0000 "application " | Freq: Once | CUTANEOUS | Status: DC

## 2015-03-31 MED ORDER — HEMOSTATIC AGENTS (NO CHARGE) OPTIME
TOPICAL | Status: DC | PRN
  Administered 2015-03-31: 1 via TOPICAL

## 2015-03-31 MED ORDER — POVIDONE-IODINE 10 % OINT PACKET
TOPICAL_OINTMENT | CUTANEOUS | Status: DC | PRN
  Administered 2015-03-31: 1 via TOPICAL

## 2015-03-31 SURGICAL SUPPLY — 42 items
APPLIER CLIP LAPSCP 10X32 DD (CLIP) ×3 IMPLANT
BAG HAMPER (MISCELLANEOUS) ×3 IMPLANT
CHLORAPREP W/TINT 26ML (MISCELLANEOUS) ×3 IMPLANT
CLOTH BEACON ORANGE TIMEOUT ST (SAFETY) ×3 IMPLANT
COVER LIGHT HANDLE STERIS (MISCELLANEOUS) ×6 IMPLANT
DECANTER SPIKE VIAL GLASS SM (MISCELLANEOUS) ×3 IMPLANT
ELECT REM PT RETURN 9FT ADLT (ELECTROSURGICAL) ×3
ELECTRODE REM PT RTRN 9FT ADLT (ELECTROSURGICAL) ×1 IMPLANT
FILTER SMOKE EVAC LAPAROSHD (FILTER) ×3 IMPLANT
FORMALIN 10 PREFIL 120ML (MISCELLANEOUS) ×3 IMPLANT
GLOVE BIOGEL PI IND STRL 7.0 (GLOVE) ×2 IMPLANT
GLOVE BIOGEL PI INDICATOR 7.0 (GLOVE) ×4
GLOVE ECLIPSE 6.5 STRL STRAW (GLOVE) ×6 IMPLANT
GLOVE EXAM NITRILE MD LF STRL (GLOVE) ×3 IMPLANT
GLOVE SURG SS PI 7.5 STRL IVOR (GLOVE) ×3 IMPLANT
GOWN STRL REUS W/ TWL XL LVL3 (GOWN DISPOSABLE) ×1 IMPLANT
GOWN STRL REUS W/TWL LRG LVL3 (GOWN DISPOSABLE) ×6 IMPLANT
GOWN STRL REUS W/TWL XL LVL3 (GOWN DISPOSABLE) ×2
HEMOSTAT SNOW SURGICEL 2X4 (HEMOSTASIS) ×3 IMPLANT
INST SET LAPROSCOPIC AP (KITS) ×3 IMPLANT
IV NS IRRIG 3000ML ARTHROMATIC (IV SOLUTION) IMPLANT
KIT ROOM TURNOVER APOR (KITS) ×3 IMPLANT
MANIFOLD NEPTUNE II (INSTRUMENTS) ×3 IMPLANT
NEEDLE INSUFFLATION 14GA 120MM (NEEDLE) ×3 IMPLANT
NS IRRIG 1000ML POUR BTL (IV SOLUTION) ×3 IMPLANT
PACK LAP CHOLE LZT030E (CUSTOM PROCEDURE TRAY) ×3 IMPLANT
PAD ARMBOARD 7.5X6 YLW CONV (MISCELLANEOUS) ×3 IMPLANT
POUCH SPECIMEN RETRIEVAL 10MM (ENDOMECHANICALS) ×3 IMPLANT
SET BASIN LINEN APH (SET/KITS/TRAYS/PACK) ×3 IMPLANT
SET TUBE IRRIG SUCTION NO TIP (IRRIGATION / IRRIGATOR) IMPLANT
SLEEVE ENDOPATH XCEL 5M (ENDOMECHANICALS) ×3 IMPLANT
SPONGE GAUZE 2X2 8PLY STER LF (GAUZE/BANDAGES/DRESSINGS) ×4
SPONGE GAUZE 2X2 8PLY STRL LF (GAUZE/BANDAGES/DRESSINGS) ×8 IMPLANT
STAPLER VISISTAT (STAPLE) ×3 IMPLANT
SUT VICRYL 0 UR6 27IN ABS (SUTURE) ×3 IMPLANT
TAPE CLOTH SURG 4X10 WHT LF (GAUZE/BANDAGES/DRESSINGS) ×3 IMPLANT
TROCAR ENDO BLADELESS 11MM (ENDOMECHANICALS) ×3 IMPLANT
TROCAR XCEL NON-BLD 5MMX100MML (ENDOMECHANICALS) ×3 IMPLANT
TROCAR XCEL UNIV SLVE 11M 100M (ENDOMECHANICALS) ×3 IMPLANT
TUBING INSUFFLATION (TUBING) ×3 IMPLANT
WARMER LAPAROSCOPE (MISCELLANEOUS) ×3 IMPLANT
YANKAUER SUCT 12FT TUBE ARGYLE (SUCTIONS) ×3 IMPLANT

## 2015-03-31 NOTE — Anesthesia Procedure Notes (Signed)
Procedure Name: Intubation Date/Time: 03/31/2015 7:44 AM Performed by: Andree Elk, Jalisha Enneking A Pre-anesthesia Checklist: Patient identified, Patient being monitored, Timeout performed, Emergency Drugs available and Suction available Patient Re-evaluated:Patient Re-evaluated prior to inductionOxygen Delivery Method: Circle System Utilized Preoxygenation: Pre-oxygenation with 100% oxygen Intubation Type: IV induction, Rapid sequence and Cricoid Pressure applied Ventilation: Mask ventilation without difficulty Laryngoscope Size: 3 and Glidescope Grade View: Grade I Tube type: Oral Tube size: 7.0 mm Number of attempts: 1 Airway Equipment and Method: Stylet Placement Confirmation: ETT inserted through vocal cords under direct vision,  positive ETCO2 and breath sounds checked- equal and bilateral Secured at: 21 cm Tube secured with: Tape Dental Injury: Teeth and Oropharynx as per pre-operative assessment

## 2015-03-31 NOTE — Transfer of Care (Signed)
Immediate Anesthesia Transfer of Care Note  Patient: Hunter Peterson  Procedure(s) Performed: Procedure(s): LAPAROSCOPIC CHOLECYSTECTOMY (N/A)  Patient Location: PACU  Anesthesia Type:General  Level of Consciousness: awake, alert , oriented and patient cooperative  Airway & Oxygen Therapy: Patient Spontanous Breathing and Patient connected to face mask oxygen  Post-op Assessment: Report given to RN and Post -op Vital signs reviewed and stable  Post vital signs: Reviewed and stable  Last Vitals:  Filed Vitals:   03/31/15 0710  BP: 112/71  Pulse:   Temp:   Resp: 10    Complications: No apparent anesthesia complications

## 2015-03-31 NOTE — Discharge Instructions (Signed)
Laparoscopic Cholecystectomy, Care After °Refer to this sheet in the next few weeks. These instructions provide you with information about caring for yourself after your procedure. Your health care provider may also give you more specific instructions. Your treatment has been planned according to current medical practices, but problems sometimes occur. Call your health care provider if you have any problems or questions after your procedure. °WHAT TO EXPECT AFTER THE PROCEDURE °After your procedure, it is common to have: °· Pain at your incision sites. You will be given pain medicines to control your pain. °· Mild nausea or vomiting. This should improve after the first 24 hours. °· Bloating and possible shoulder pain from the gas that was used during the procedure. This will improve after the first 24 hours. °HOME CARE INSTRUCTIONS °Incision Care °· Follow instructions from your health care provider about how to take care of your incisions. Make sure you: °¨ Wash your hands with soap and water before you change your bandage (dressing). If soap and water are not available, use hand sanitizer. °¨ Change your dressing as told by your health care provider. °¨ Leave stitches (sutures), skin glue, or adhesive strips in place. These skin closures may need to be in place for 2 weeks or longer. If adhesive strip edges start to loosen and curl up, you may trim the loose edges. Do not remove adhesive strips completely unless your health care provider tells you to do that. °· Do not take baths, swim, or use a hot tub until your health care provider approves. Ask your health care provider if you can take showers. You may only be allowed to take sponge baths for bathing. °General Instructions °· Take over-the-counter and prescription medicines only as told by your health care provider. °· Do not drive or operate heavy machinery while taking prescription pain medicine. °· Return to your normal diet as told by your health care  provider. °· Do not lift anything that is heavier than 10 lb (4.5 kg). °· Do not play contact sports for one week or until your health care provider approves. °SEEK MEDICAL CARE IF:  °· You have redness, swelling, or pain at the site of your incision. °· You have fluid, blood, or pus coming from your incision. °· You notice a bad smell coming from your incision area. °· Your surgical incisions break open. °· You have a fever. °SEEK IMMEDIATE MEDICAL CARE IF: °· You develop a rash. °· You have difficulty breathing. °· You have chest pain. °· You have increasing pain in your shoulders (shoulder strap areas). °· You faint or have dizzy episodes while you are standing. °· You have severe pain in your abdomen. °· You have nausea or vomiting that lasts for more than one day. °  °This information is not intended to replace advice given to you by your health care provider. Make sure you discuss any questions you have with your health care provider. °  °Document Released: 05/03/2005 Document Revised: 01/22/2015 Document Reviewed: 12/13/2012 °Elsevier Interactive Patient Education ©2016 [employer] ° °

## 2015-03-31 NOTE — Interval H&P Note (Signed)
History and Physical Interval Note:  03/31/2015 7:23 AM  [place]  has presented today for surgery, with the diagnosis of cholelithiasis  The various methods of treatment have been discussed with the patient and family. After consideration of risks, benefits and other options for treatment, the patient has consented to  Procedure(s) with comments: LAPAROSCOPIC CHOLECYSTECTOMY (N/A) - pt knows to arrive at 6:15 as a surgical intervention .  The patient's history has been reviewed, patient examined, no change in status, stable for surgery.  I have reviewed the patient's chart and labs.  Questions were answered to the patient's satisfaction.     Aviva Signs A

## 2015-03-31 NOTE — Op Note (Signed)
Patient:  Hunter Peterson  DOB:  1946-02-17  MRN:  [id number]   Preop Diagnosis:  Cholelithiasis, biliary colic  Postop Diagnosis:  Same  Procedure:  Laparoscopic cholecystectomy  Surgeon:  Aviva Signs, M.D.  Anes:  Gen. endotracheal  Indications:  Patient is a 69 year old white male who presents with biliary colic secondary to cholelithiasis. The risks and benefits of the procedure including bleeding, infection, hepatobiliary injury, and the possibility of an open procedure were fully explained to the patient, who gave informed consent.  Procedure note:  The patient was placed the supine position. After induction of general endotracheal anesthesia, the abdomen was prepped and draped using the usual sterile technique with DuraPrep. Surgical site confirmation was performed.  A supraumbilical incision was made down to the fascia. A Veress needle was introduced into the abdominal cavity and confirmation of placement was done using the saline drop test. The abdomen was then insufflated to 16 mmHg pressure. An 11 mm trocar was introduced into the abdominal cavity under direct visualization without difficulty. The patient was placed in reverse Trendelenburg position and additional 11 mm trocar was placed the epigastric region and 5 mm trochars were placed in the right upper quadrant and right flank regions. The liver was inspected and noted to be age-appropriate. The gallbladder was retracted in a dynamic fashion in order to provide a critical view of the triangle of Calot. The cystic duct was first identified. Its junction to the infundibulum was fully identified. Endoclips were placed proximally and distally on the cystic duct, and the cystic duct was divided. This was likewise done cystic artery. The gallbladder was freed away from the gallbladder fossa using Bovie electrocautery. The gallbladder was delivered through the epigastric trocar site using an Endo Catch bag. The gallbladder fossa was  inspected and no abnormal bleeding or bile leakage was noted. Surgicel was placed the gallbladder fossa. All fluid and air were then evacuated from the abdominal cavity prior to removal of the trochars.  All wounds were irrigated with normal saline. All wounds were injected with 0.5% Sensorcaine. The supraumbilical fascia was reapproximated using 0 Vicryl interrupted suture. All skin incisions were closed using staples. Betadine ointment and dry sterile dressings were applied.  All tape and needle counts were correct at the end of the procedure. Patient was extubated in the operating room and transferred to PACU in stable condition.  Complications:  None  EBL:  Minimal  Specimen:  Gallbladder

## 2015-03-31 NOTE — Anesthesia Postprocedure Evaluation (Signed)
  Anesthesia Post-op Note Late entry  Patient: Hunter Peterson  Procedure(s) Performed: Procedure(s): LAPAROSCOPIC CHOLECYSTECTOMY (N/A)  Patient Location: PACU  Anesthesia Type:General  Level of Consciousness: awake, alert , oriented and patient cooperative  Airway and Oxygen Therapy: Patient Spontanous Breathing  Post-op Pain: mild  Post-op Assessment: Post-op Vital signs reviewed, Patient's Cardiovascular Status Stable, Respiratory Function Stable, Patent Airway, No signs of Nausea or vomiting and Pain level controlled              Post-op Vital Signs: Reviewed and stable  Last Vitals:  Filed Vitals:   03/31/15 0910  BP:   Pulse: 65  Temp:   Resp: 22    Complications: No apparent anesthesia complications

## 2015-03-31 NOTE — Anesthesia Preprocedure Evaluation (Signed)
Anesthesia Evaluation  Patient identified by MRN, date of birth, ID band Patient awake    Reviewed: Allergy & Precautions, NPO status , Patient's Chart, lab work & pertinent test results, reviewed documented beta blocker date and time   Airway Mallampati: IV  TM Distance: <3 FB Neck ROM: Full  Mouth opening: Limited Mouth Opening  Dental  (+) Teeth Intact, Caps, Loose, Dental Advisory Given,    Pulmonary    Pulmonary exam normal        Cardiovascular Exercise Tolerance: Poor hypertension, Pt. on medications and Pt. on home beta blockers Normal cardiovascular exam+ dysrhythmias Ventricular Tachycardia  Rhythm:Regular     Neuro/Psych    GI/Hepatic   Endo/Other  diabetes, Well Controlled, Type 2, Oral Hypoglycemic Agents  Renal/GU      Musculoskeletal   Abdominal Normal abdominal exam  (+)   Peds  Hematology   Anesthesia Other Findings   Reproductive/Obstetrics                             Anesthesia Physical Anesthesia Plan  ASA: III  Anesthesia Plan: General   Post-op Pain Management:    Induction: Intravenous  Airway Management Planned: Oral ETT  Additional Equipment:   Intra-op Plan:   Post-operative Plan: Extubation in OR  Informed Consent: I have reviewed the patients History and Physical, chart, labs and discussed the procedure including the risks, benefits and alternatives for the proposed anesthesia with the patient or authorized representative who has indicated his/her understanding and acceptance.   Dental advisory given  Plan Discussed with: CRNA  Anesthesia Plan Comments:         Anesthesia Quick Evaluation

## 2015-04-01 ENCOUNTER — Encounter (HOSPITAL_COMMUNITY): Payer: Self-pay | Admitting: General Surgery

## 2015-09-09 DIAGNOSIS — E782 Mixed hyperlipidemia: Secondary | ICD-10-CM | POA: Diagnosis not present

## 2015-09-09 DIAGNOSIS — E119 Type 2 diabetes mellitus without complications: Secondary | ICD-10-CM | POA: Diagnosis not present

## 2015-09-09 DIAGNOSIS — I10 Essential (primary) hypertension: Secondary | ICD-10-CM | POA: Diagnosis not present

## 2015-09-09 DIAGNOSIS — Z1389 Encounter for screening for other disorder: Secondary | ICD-10-CM | POA: Diagnosis not present

## 2015-09-09 DIAGNOSIS — Z6826 Body mass index (BMI) 26.0-26.9, adult: Secondary | ICD-10-CM | POA: Diagnosis not present

## 2015-09-09 DIAGNOSIS — E663 Overweight: Secondary | ICD-10-CM | POA: Diagnosis not present

## 2015-12-03 DIAGNOSIS — E119 Type 2 diabetes mellitus without complications: Secondary | ICD-10-CM | POA: Diagnosis not present

## 2015-12-03 DIAGNOSIS — Z6826 Body mass index (BMI) 26.0-26.9, adult: Secondary | ICD-10-CM | POA: Diagnosis not present

## 2015-12-03 DIAGNOSIS — I10 Essential (primary) hypertension: Secondary | ICD-10-CM | POA: Diagnosis not present

## 2015-12-03 DIAGNOSIS — Z1389 Encounter for screening for other disorder: Secondary | ICD-10-CM | POA: Diagnosis not present

## 2016-03-08 DIAGNOSIS — E785 Hyperlipidemia, unspecified: Secondary | ICD-10-CM | POA: Diagnosis not present

## 2016-03-08 DIAGNOSIS — E119 Type 2 diabetes mellitus without complications: Secondary | ICD-10-CM | POA: Diagnosis not present

## 2016-03-08 DIAGNOSIS — E782 Mixed hyperlipidemia: Secondary | ICD-10-CM | POA: Diagnosis not present

## 2016-03-08 DIAGNOSIS — Z23 Encounter for immunization: Secondary | ICD-10-CM | POA: Diagnosis not present

## 2016-03-08 DIAGNOSIS — I10 Essential (primary) hypertension: Secondary | ICD-10-CM | POA: Diagnosis not present

## 2016-03-08 DIAGNOSIS — Z1389 Encounter for screening for other disorder: Secondary | ICD-10-CM | POA: Diagnosis not present

## 2016-03-08 DIAGNOSIS — Z6826 Body mass index (BMI) 26.0-26.9, adult: Secondary | ICD-10-CM | POA: Diagnosis not present

## 2016-03-08 DIAGNOSIS — Z0000 Encounter for general adult medical examination without abnormal findings: Secondary | ICD-10-CM | POA: Diagnosis not present

## 2016-06-21 DIAGNOSIS — E785 Hyperlipidemia, unspecified: Secondary | ICD-10-CM | POA: Diagnosis not present

## 2016-06-21 DIAGNOSIS — I10 Essential (primary) hypertension: Secondary | ICD-10-CM | POA: Diagnosis not present

## 2016-06-21 DIAGNOSIS — Z6826 Body mass index (BMI) 26.0-26.9, adult: Secondary | ICD-10-CM | POA: Diagnosis not present

## 2016-06-21 DIAGNOSIS — E119 Type 2 diabetes mellitus without complications: Secondary | ICD-10-CM | POA: Diagnosis not present

## 2016-06-21 DIAGNOSIS — Z1389 Encounter for screening for other disorder: Secondary | ICD-10-CM | POA: Diagnosis not present

## 2016-06-21 DIAGNOSIS — E663 Overweight: Secondary | ICD-10-CM | POA: Diagnosis not present

## 2016-09-24 DIAGNOSIS — Z6826 Body mass index (BMI) 26.0-26.9, adult: Secondary | ICD-10-CM | POA: Diagnosis not present

## 2016-09-24 DIAGNOSIS — E663 Overweight: Secondary | ICD-10-CM | POA: Diagnosis not present

## 2016-09-24 DIAGNOSIS — Z1389 Encounter for screening for other disorder: Secondary | ICD-10-CM | POA: Diagnosis not present

## 2016-09-24 DIAGNOSIS — E119 Type 2 diabetes mellitus without complications: Secondary | ICD-10-CM | POA: Diagnosis not present

## 2017-03-23 DIAGNOSIS — Z1389 Encounter for screening for other disorder: Secondary | ICD-10-CM | POA: Diagnosis not present

## 2017-03-23 DIAGNOSIS — E782 Mixed hyperlipidemia: Secondary | ICD-10-CM | POA: Diagnosis not present

## 2017-03-23 DIAGNOSIS — Z0001 Encounter for general adult medical examination with abnormal findings: Secondary | ICD-10-CM | POA: Diagnosis not present

## 2017-03-23 DIAGNOSIS — Z6826 Body mass index (BMI) 26.0-26.9, adult: Secondary | ICD-10-CM | POA: Diagnosis not present

## 2017-03-23 DIAGNOSIS — Z23 Encounter for immunization: Secondary | ICD-10-CM | POA: Diagnosis not present

## 2017-03-23 DIAGNOSIS — E119 Type 2 diabetes mellitus without complications: Secondary | ICD-10-CM | POA: Diagnosis not present

## 2017-03-23 DIAGNOSIS — I472 Ventricular tachycardia: Secondary | ICD-10-CM | POA: Diagnosis not present

## 2017-03-23 DIAGNOSIS — I10 Essential (primary) hypertension: Secondary | ICD-10-CM | POA: Diagnosis not present

## 2017-08-25 DIAGNOSIS — Z6826 Body mass index (BMI) 26.0-26.9, adult: Secondary | ICD-10-CM | POA: Diagnosis not present

## 2017-08-25 DIAGNOSIS — E782 Mixed hyperlipidemia: Secondary | ICD-10-CM | POA: Diagnosis not present

## 2017-08-25 DIAGNOSIS — E119 Type 2 diabetes mellitus without complications: Secondary | ICD-10-CM | POA: Diagnosis not present

## 2017-08-25 DIAGNOSIS — E663 Overweight: Secondary | ICD-10-CM | POA: Diagnosis not present

## 2017-08-25 DIAGNOSIS — I10 Essential (primary) hypertension: Secondary | ICD-10-CM | POA: Diagnosis not present

## 2017-08-25 DIAGNOSIS — Z1389 Encounter for screening for other disorder: Secondary | ICD-10-CM | POA: Diagnosis not present

## 2017-11-04 DIAGNOSIS — Z6826 Body mass index (BMI) 26.0-26.9, adult: Secondary | ICD-10-CM | POA: Diagnosis not present

## 2017-11-04 DIAGNOSIS — E119 Type 2 diabetes mellitus without complications: Secondary | ICD-10-CM | POA: Diagnosis not present

## 2017-11-04 DIAGNOSIS — Z125 Encounter for screening for malignant neoplasm of prostate: Secondary | ICD-10-CM | POA: Diagnosis not present

## 2017-11-04 DIAGNOSIS — E663 Overweight: Secondary | ICD-10-CM | POA: Diagnosis not present

## 2017-11-04 DIAGNOSIS — Z1389 Encounter for screening for other disorder: Secondary | ICD-10-CM | POA: Diagnosis not present

## 2018-03-07 DIAGNOSIS — E663 Overweight: Secondary | ICD-10-CM | POA: Diagnosis not present

## 2018-03-07 DIAGNOSIS — I10 Essential (primary) hypertension: Secondary | ICD-10-CM | POA: Diagnosis not present

## 2018-03-07 DIAGNOSIS — Z6826 Body mass index (BMI) 26.0-26.9, adult: Secondary | ICD-10-CM | POA: Diagnosis not present

## 2018-03-07 DIAGNOSIS — E785 Hyperlipidemia, unspecified: Secondary | ICD-10-CM | POA: Diagnosis not present

## 2018-03-07 DIAGNOSIS — Z23 Encounter for immunization: Secondary | ICD-10-CM | POA: Diagnosis not present

## 2018-03-07 DIAGNOSIS — E119 Type 2 diabetes mellitus without complications: Secondary | ICD-10-CM | POA: Diagnosis not present

## 2018-03-07 DIAGNOSIS — Z1389 Encounter for screening for other disorder: Secondary | ICD-10-CM | POA: Diagnosis not present

## 2018-03-20 DIAGNOSIS — Z6826 Body mass index (BMI) 26.0-26.9, adult: Secondary | ICD-10-CM | POA: Diagnosis not present

## 2018-03-20 DIAGNOSIS — M5410 Radiculopathy, site unspecified: Secondary | ICD-10-CM | POA: Diagnosis not present

## 2018-03-20 DIAGNOSIS — M545 Low back pain: Secondary | ICD-10-CM | POA: Diagnosis not present

## 2018-03-20 DIAGNOSIS — E663 Overweight: Secondary | ICD-10-CM | POA: Diagnosis not present

## 2018-03-24 DIAGNOSIS — Z1211 Encounter for screening for malignant neoplasm of colon: Secondary | ICD-10-CM | POA: Diagnosis not present

## 2018-10-16 DIAGNOSIS — Z0000 Encounter for general adult medical examination without abnormal findings: Secondary | ICD-10-CM | POA: Diagnosis not present

## 2018-10-16 DIAGNOSIS — Z1389 Encounter for screening for other disorder: Secondary | ICD-10-CM | POA: Diagnosis not present

## 2018-10-16 DIAGNOSIS — Z681 Body mass index (BMI) 19 or less, adult: Secondary | ICD-10-CM | POA: Diagnosis not present

## 2018-10-26 DIAGNOSIS — Z1211 Encounter for screening for malignant neoplasm of colon: Secondary | ICD-10-CM | POA: Diagnosis not present

## 2018-11-23 DIAGNOSIS — Z1389 Encounter for screening for other disorder: Secondary | ICD-10-CM | POA: Diagnosis not present

## 2018-11-23 DIAGNOSIS — E119 Type 2 diabetes mellitus without complications: Secondary | ICD-10-CM | POA: Diagnosis not present

## 2018-11-23 DIAGNOSIS — I472 Ventricular tachycardia: Secondary | ICD-10-CM | POA: Diagnosis not present

## 2018-11-23 DIAGNOSIS — E785 Hyperlipidemia, unspecified: Secondary | ICD-10-CM | POA: Diagnosis not present

## 2018-11-23 DIAGNOSIS — Z681 Body mass index (BMI) 19 or less, adult: Secondary | ICD-10-CM | POA: Diagnosis not present

## 2018-11-23 DIAGNOSIS — I10 Essential (primary) hypertension: Secondary | ICD-10-CM | POA: Diagnosis not present

## 2019-03-05 DIAGNOSIS — Z23 Encounter for immunization: Secondary | ICD-10-CM | POA: Diagnosis not present

## 2019-04-16 DIAGNOSIS — I10 Essential (primary) hypertension: Secondary | ICD-10-CM | POA: Diagnosis not present

## 2019-04-16 DIAGNOSIS — E7849 Other hyperlipidemia: Secondary | ICD-10-CM | POA: Diagnosis not present

## 2019-04-16 DIAGNOSIS — E119 Type 2 diabetes mellitus without complications: Secondary | ICD-10-CM | POA: Diagnosis not present

## 2019-05-17 DIAGNOSIS — I10 Essential (primary) hypertension: Secondary | ICD-10-CM | POA: Diagnosis not present

## 2019-05-17 DIAGNOSIS — E119 Type 2 diabetes mellitus without complications: Secondary | ICD-10-CM | POA: Diagnosis not present

## 2019-05-17 DIAGNOSIS — E7849 Other hyperlipidemia: Secondary | ICD-10-CM | POA: Diagnosis not present

## 2019-07-09 DIAGNOSIS — E7849 Other hyperlipidemia: Secondary | ICD-10-CM | POA: Diagnosis not present

## 2019-07-09 DIAGNOSIS — Z6826 Body mass index (BMI) 26.0-26.9, adult: Secondary | ICD-10-CM | POA: Diagnosis not present

## 2019-07-09 DIAGNOSIS — I10 Essential (primary) hypertension: Secondary | ICD-10-CM | POA: Diagnosis not present

## 2019-07-09 DIAGNOSIS — E663 Overweight: Secondary | ICD-10-CM | POA: Diagnosis not present

## 2019-07-09 DIAGNOSIS — E119 Type 2 diabetes mellitus without complications: Secondary | ICD-10-CM | POA: Diagnosis not present

## 2019-07-10 DIAGNOSIS — E119 Type 2 diabetes mellitus without complications: Secondary | ICD-10-CM | POA: Diagnosis not present

## 2019-07-10 DIAGNOSIS — Z6826 Body mass index (BMI) 26.0-26.9, adult: Secondary | ICD-10-CM | POA: Diagnosis not present

## 2019-07-10 DIAGNOSIS — I10 Essential (primary) hypertension: Secondary | ICD-10-CM | POA: Diagnosis not present

## 2019-07-10 DIAGNOSIS — E663 Overweight: Secondary | ICD-10-CM | POA: Diagnosis not present

## 2019-07-13 DIAGNOSIS — E663 Overweight: Secondary | ICD-10-CM | POA: Diagnosis not present

## 2019-07-13 DIAGNOSIS — N410 Acute prostatitis: Secondary | ICD-10-CM | POA: Diagnosis not present

## 2019-07-13 DIAGNOSIS — N403 Nodular prostate with lower urinary tract symptoms: Secondary | ICD-10-CM | POA: Diagnosis not present

## 2019-07-13 DIAGNOSIS — Z6825 Body mass index (BMI) 25.0-25.9, adult: Secondary | ICD-10-CM | POA: Diagnosis not present

## 2019-07-15 DIAGNOSIS — I10 Essential (primary) hypertension: Secondary | ICD-10-CM | POA: Diagnosis not present

## 2019-07-15 DIAGNOSIS — E7849 Other hyperlipidemia: Secondary | ICD-10-CM | POA: Diagnosis not present

## 2019-07-15 DIAGNOSIS — E119 Type 2 diabetes mellitus without complications: Secondary | ICD-10-CM | POA: Diagnosis not present

## 2019-07-29 DIAGNOSIS — Z23 Encounter for immunization: Secondary | ICD-10-CM | POA: Diagnosis not present

## 2019-09-18 DIAGNOSIS — L821 Other seborrheic keratosis: Secondary | ICD-10-CM | POA: Diagnosis not present

## 2019-09-18 DIAGNOSIS — L570 Actinic keratosis: Secondary | ICD-10-CM | POA: Diagnosis not present

## 2019-10-24 DIAGNOSIS — Z0000 Encounter for general adult medical examination without abnormal findings: Secondary | ICD-10-CM | POA: Diagnosis not present

## 2019-10-24 DIAGNOSIS — Z1389 Encounter for screening for other disorder: Secondary | ICD-10-CM | POA: Diagnosis not present

## 2019-10-24 DIAGNOSIS — E119 Type 2 diabetes mellitus without complications: Secondary | ICD-10-CM | POA: Diagnosis not present

## 2019-10-24 DIAGNOSIS — Z6825 Body mass index (BMI) 25.0-25.9, adult: Secondary | ICD-10-CM | POA: Diagnosis not present

## 2019-10-24 DIAGNOSIS — E785 Hyperlipidemia, unspecified: Secondary | ICD-10-CM | POA: Diagnosis not present

## 2020-01-14 ENCOUNTER — Ambulatory Visit
Admission: EM | Admit: 2020-01-14 | Discharge: 2020-01-14 | Disposition: A | Discharge status: Home / Self Care | Payer: Medicare Other | Attending: Emergency Medicine | Admitting: Emergency Medicine

## 2020-01-14 ENCOUNTER — Other Ambulatory Visit: Payer: Self-pay

## 2020-01-14 DIAGNOSIS — F411 Generalized anxiety disorder: Secondary | ICD-10-CM

## 2020-01-14 MED ORDER — HYDROXYZINE HCL 25 MG PO TABS: 25.0000 mg | ORAL_TABLET | Freq: Every day | ORAL | 0 refills | Status: DC

## 2020-01-14 NOTE — ED Provider Notes (Signed)
[place]   [id number] 01/14/20 Arrival Time: 7893  CC: Anxiety  SUBJECTIVE:  Hunter Peterson is a 74 y.o. male who complains of anxiety x 2 weeks.  Reports issues with his plumbing.  Describes anxiety as feeling tense and having trouble sleeping.  Does not have a hx of anxiety, and currently does not take medications.  Symptoms are made worse with stress.  Denies similar symptoms in the past.  Denies HI or SI.  Complains of trouble sleeping.  Patient denies fever, chills, anhedonia, changes in normal activities, nausea, vomiting, chest pain, SOB, abdominal pain, changes in bowel or bladder habits.     ROS: As per HPI.  All other pertinent ROS negative.     Past Medical History:  Diagnosis Date  . BPH (benign prostatic hypertrophy)   . H/O ventricular tachycardia 2000   pt had cardiac catherization and it was normal.  Never had any more problelms  . History of adenomatous polyp of colon   . History of ventricular tachycardia documented wide QRS VT   5/ 2000  during stress test was defibulated and cathed  . Hyperlipidemia   . RBBB (right bundle branch block)   . Spermatocele    LEFT  . Tachyarrhythmia    hx recurrent wide complex QRS --  controlled w/ metoprolol  . Type 2 diabetes mellitus ([place])    Past Surgical History:  Procedure Laterality Date  . CARDIAC  EP STUDY  10-20-1998  dr gregg taylor   no evidence inducible VT or SVT  . CARDIAC CATHETERIZATION  5/ 2000    normal coronary arteries and LV preserved  . CHOLECYSTECTOMY N/A 03/31/2015   Procedure: LAPAROSCOPIC CHOLECYSTECTOMY;  Surgeon: Aviva Signs, MD;  Location: [facility];  Service: General;  Laterality: N/A;  . COLONOSCOPY N/A 10/02/2013   Procedure: COLONOSCOPY;  Surgeon: Jamesetta So, MD;  Location: [facility];  Service: Gastroenterology;  Laterality: N/A;  . SPERMATOCELECTOMY Left 03/28/2014   Procedure: LEFT SPERMATOCELECTOMY;  Surgeon: Malka So, MD;  Location: [facility];   Service: Urology;  Laterality: Left;  . TONSILLECTOMY  as child  . TRANSTHORACIC ECHOCARDIOGRAM  10-06-1998   normal LV/  mild TR and MR   No Known Allergies No current facility-administered medications on file prior to encounter.   Current Outpatient Medications on File Prior to Encounter  Medication Sig Dispense Refill  . enalapril (VASOTEC) 2.5 MG tablet Take 2.5 mg by mouth every morning.     . metFORMIN (GLUCOPHAGE) 500 MG tablet Take 500 mg by mouth 2 (two) times daily with a meal.    . metoprolol tartrate (LOPRESSOR) 25 MG tablet Take 25 mg by mouth 2 (two) times daily.    . Saw Palmetto, Serenoa repens, (SAW PALMETTO PO) Take 1 tablet by mouth 2 (two) times daily.    . simvastatin (ZOCOR) 40 MG tablet Take 40 mg by mouth every morning.     . tamsulosin (FLOMAX) 0.4 MG CAPS capsule Take 0.4 mg by mouth every morning.     . zolpidem (AMBIEN) 10 MG tablet Take 10 mg by mouth at bedtime as needed for sleep.     Social History   Socioeconomic History  . Marital status: Married    Spouse name: Not on file  . Number of children: Not on file  . Years of education: Not on file  . Highest education level: Not on file  Occupational History  . Not on file  Tobacco Use  .  Smoking status: Never Smoker  . Smokeless tobacco: Never Used  Substance and Sexual Activity  . Alcohol use: Yes    Comment: occasional  . Drug use: No  . Sexual activity: Not on file  Other Topics Concern  . Not on file  Social History Narrative  . Not on file   Social Determinants of Health   Financial Resource Strain:   . Difficulty of Paying Living Expenses: Not on file  Food Insecurity:   . Worried About Charity fundraiser in the Last Year: Not on file  . Ran Out of Food in the Last Year: Not on file  Transportation Needs:   . Lack of Transportation (Medical): Not on file  . Lack of Transportation (Non-Medical): Not on file  Physical Activity:   . Days of Exercise per Week: Not on file  .  Minutes of Exercise per Session: Not on file  Stress:   . Feeling of Stress : Not on file  Social Connections:   . Frequency of Communication with Friends and Family: Not on file  . Frequency of Social Gatherings with Friends and Family: Not on file  . Attends Religious Services: Not on file  . Active Member of Clubs or Organizations: Not on file  . Attends Archivist Meetings: Not on file  . Marital Status: Not on file  Intimate Partner Violence:   . Fear of Current or Ex-Partner: Not on file  . Emotionally Abused: Not on file  . Physically Abused: Not on file  . Sexually Abused: Not on file   Family History  Family history unknown: Yes    OBJECTIVE:  Vitals:   01/14/20 1743  BP: (!) 144/92  Pulse: 94  Resp: 18  Temp: (!) 97.5 F (36.4 C)  SpO2: 94%    General appearance: alert; no distress Eyes: PERRLA; EOMI HENT: normocephalic; atraumatic; oropharynx clear Neck: supple with FROM Lungs: normal respiratory effort Heart: regular rate and rhythm.   Skin: warm and dry Neurologic: ambulates without difficulty Psychological: alert and cooperative; normal mood and affect   ASSESSMENT & PLAN:  1. Anxiety state     Meds ordered this encounter  Medications  . DISCONTD: hydrOXYzine (ATARAX/VISTARIL) 25 MG tablet    Sig: Take 1 tablet (25 mg total) by mouth at bedtime.    Dispense:  12 tablet    Refill:  0    Order Specific Question:   Supervising Provider    Answer:   Raylene Everts [[id number]]  . hydrOXYzine (ATARAX/VISTARIL) 25 MG tablet    Sig: Take 1 tablet (25 mg total) by mouth at bedtime.    Dispense:  12 tablet    Refill:  0    Order Specific Question:   Supervising Provider    Answer:   Raylene Everts [[id number]]   Rest and drink fluids Eat a well-balanced diet, and avoid excessive caffeine intake Prescribed hydroxyzine nightly for symptom relief Some things you may try doing to help alleviate your symptoms include: keeping a journal,  exercise, talking to a friend or relative, listening to music, going for a walk or hike outside, or other activities that you may find enjoyable Recommending further evaluation and management with PCP Return or go to ER if you have any new or worsening symptoms such as fever, chills, fatigue, worsening shortness of breath, wheezing, chest pain, nausea, vomiting, abdominal pain, changes in bowel or bladder habits, etc...   Reviewed expectations re: course of current medical issues. Questions answered.  Outlined signs and symptoms indicating need for more acute intervention. Patient verbalized understanding. After Visit Summary given.   Lestine Box, PA-C 01/14/20 1825

## 2020-01-14 NOTE — Discharge Instructions (Signed)
Rest and drink fluids Eat a well-balanced diet, and avoid excessive caffeine intake Prescribed hydroxyzine nightly for symptom relief Some things you may try doing to help alleviate your symptoms include: keeping a journal, exercise, talking to a friend or relative, listening to music, going for a walk or hike outside, or other activities that you may find enjoyable Recommending further evaluation and management with PCP Return or go to ER if you have any new or worsening symptoms such as fever, chills, fatigue, worsening shortness of breath, wheezing, chest pain, nausea, vomiting, abdominal pain, changes in bowel or bladder habits, etc..Marland Kitchen

## 2020-01-14 NOTE — ED Notes (Addendum)
Patient reports that in the past anxiety has been so severe that he was having thoughts of self harm. Reports that he is not actively having any SI or HI thoughts. Reports he truly wants help with the anxiety so that he does not feel this way anymore. Pt states "I want to live a long long life, but I want help for my anxiety I am feeling now.". Pt thoroughly educated on return precautions. [place] notified.

## 2020-01-14 NOTE — ED Triage Notes (Signed)
Pt presents with complaints of anxiety over the last 3 weeks. Patient states he has had issues with his house that has caused frequent panic attacks.

## 2020-02-15 DIAGNOSIS — E119 Type 2 diabetes mellitus without complications: Secondary | ICD-10-CM | POA: Diagnosis not present

## 2020-02-15 DIAGNOSIS — Z86010 Personal history of colonic polyps: Secondary | ICD-10-CM | POA: Diagnosis not present

## 2020-02-15 DIAGNOSIS — Z0000 Encounter for general adult medical examination without abnormal findings: Secondary | ICD-10-CM | POA: Diagnosis not present

## 2020-02-15 DIAGNOSIS — Z1389 Encounter for screening for other disorder: Secondary | ICD-10-CM | POA: Diagnosis not present

## 2020-05-03 DIAGNOSIS — Z23 Encounter for immunization: Secondary | ICD-10-CM | POA: Diagnosis not present

## 2020-05-12 DIAGNOSIS — L218 Other seborrheic dermatitis: Secondary | ICD-10-CM | POA: Diagnosis not present

## 2020-05-12 DIAGNOSIS — D224 Melanocytic nevi of scalp and neck: Secondary | ICD-10-CM | POA: Diagnosis not present

## 2020-05-12 DIAGNOSIS — L821 Other seborrheic keratosis: Secondary | ICD-10-CM | POA: Diagnosis not present

## 2020-05-12 DIAGNOSIS — L570 Actinic keratosis: Secondary | ICD-10-CM | POA: Diagnosis not present

## 2020-07-29 DIAGNOSIS — Z1389 Encounter for screening for other disorder: Secondary | ICD-10-CM | POA: Diagnosis not present

## 2020-07-29 DIAGNOSIS — Z6825 Body mass index (BMI) 25.0-25.9, adult: Secondary | ICD-10-CM | POA: Diagnosis not present

## 2020-07-29 DIAGNOSIS — E663 Overweight: Secondary | ICD-10-CM | POA: Diagnosis not present

## 2020-07-29 DIAGNOSIS — Z125 Encounter for screening for malignant neoplasm of prostate: Secondary | ICD-10-CM | POA: Diagnosis not present

## 2020-07-29 DIAGNOSIS — N400 Enlarged prostate without lower urinary tract symptoms: Secondary | ICD-10-CM | POA: Diagnosis not present

## 2020-07-29 DIAGNOSIS — I10 Essential (primary) hypertension: Secondary | ICD-10-CM | POA: Diagnosis not present

## 2020-07-29 DIAGNOSIS — E7849 Other hyperlipidemia: Secondary | ICD-10-CM | POA: Diagnosis not present

## 2020-07-29 DIAGNOSIS — E118 Type 2 diabetes mellitus with unspecified complications: Secondary | ICD-10-CM | POA: Diagnosis not present

## 2020-08-01 DIAGNOSIS — Z1211 Encounter for screening for malignant neoplasm of colon: Secondary | ICD-10-CM | POA: Diagnosis not present

## 2020-11-13 DIAGNOSIS — Z0001 Encounter for general adult medical examination with abnormal findings: Secondary | ICD-10-CM | POA: Diagnosis not present

## 2020-11-13 DIAGNOSIS — Z6826 Body mass index (BMI) 26.0-26.9, adult: Secondary | ICD-10-CM | POA: Diagnosis not present

## 2020-11-13 DIAGNOSIS — E118 Type 2 diabetes mellitus with unspecified complications: Secondary | ICD-10-CM | POA: Diagnosis not present

## 2020-11-13 DIAGNOSIS — Z1331 Encounter for screening for depression: Secondary | ICD-10-CM | POA: Diagnosis not present

## 2020-11-13 DIAGNOSIS — I472 Ventricular tachycardia: Secondary | ICD-10-CM | POA: Diagnosis not present

## 2020-11-13 DIAGNOSIS — I10 Essential (primary) hypertension: Secondary | ICD-10-CM | POA: Diagnosis not present

## 2020-11-13 DIAGNOSIS — N400 Enlarged prostate without lower urinary tract symptoms: Secondary | ICD-10-CM | POA: Diagnosis not present

## 2020-11-13 DIAGNOSIS — Z86010 Personal history of colonic polyps: Secondary | ICD-10-CM | POA: Diagnosis not present

## 2020-11-13 DIAGNOSIS — E663 Overweight: Secondary | ICD-10-CM | POA: Diagnosis not present

## 2020-11-13 DIAGNOSIS — E119 Type 2 diabetes mellitus without complications: Secondary | ICD-10-CM | POA: Diagnosis not present

## 2020-12-15 DIAGNOSIS — L821 Other seborrheic keratosis: Secondary | ICD-10-CM | POA: Diagnosis not present

## 2020-12-15 DIAGNOSIS — L570 Actinic keratosis: Secondary | ICD-10-CM | POA: Diagnosis not present

## 2020-12-15 DIAGNOSIS — L218 Other seborrheic dermatitis: Secondary | ICD-10-CM | POA: Diagnosis not present

## 2021-03-23 DIAGNOSIS — Z23 Encounter for immunization: Secondary | ICD-10-CM | POA: Diagnosis not present

## 2021-06-09 DIAGNOSIS — H2513 Age-related nuclear cataract, bilateral: Secondary | ICD-10-CM | POA: Diagnosis not present

## 2021-08-03 DIAGNOSIS — E782 Mixed hyperlipidemia: Secondary | ICD-10-CM | POA: Diagnosis not present

## 2021-08-03 DIAGNOSIS — Z125 Encounter for screening for malignant neoplasm of prostate: Secondary | ICD-10-CM | POA: Diagnosis not present

## 2021-08-03 DIAGNOSIS — N400 Enlarged prostate without lower urinary tract symptoms: Secondary | ICD-10-CM | POA: Diagnosis not present

## 2021-08-03 DIAGNOSIS — Z86010 Personal history of colonic polyps: Secondary | ICD-10-CM | POA: Diagnosis not present

## 2021-08-03 DIAGNOSIS — E7849 Other hyperlipidemia: Secondary | ICD-10-CM | POA: Diagnosis not present

## 2021-08-03 DIAGNOSIS — Z6826 Body mass index (BMI) 26.0-26.9, adult: Secondary | ICD-10-CM | POA: Diagnosis not present

## 2021-08-03 DIAGNOSIS — E118 Type 2 diabetes mellitus with unspecified complications: Secondary | ICD-10-CM | POA: Diagnosis not present

## 2021-08-03 DIAGNOSIS — E663 Overweight: Secondary | ICD-10-CM | POA: Diagnosis not present

## 2021-08-03 DIAGNOSIS — I10 Essential (primary) hypertension: Secondary | ICD-10-CM | POA: Diagnosis not present

## 2021-08-14 ENCOUNTER — Ambulatory Visit (INDEPENDENT_AMBULATORY_CARE_PROVIDER_SITE_OTHER): Payer: Medicare Other | Admitting: Urology

## 2021-08-14 ENCOUNTER — Encounter: Payer: Self-pay | Admitting: Urology

## 2021-08-14 VITALS — BP 143/89 | HR 77

## 2021-08-14 DIAGNOSIS — N401 Enlarged prostate with lower urinary tract symptoms: Secondary | ICD-10-CM

## 2021-08-14 DIAGNOSIS — R972 Elevated prostate specific antigen [PSA]: Secondary | ICD-10-CM

## 2021-08-14 DIAGNOSIS — N138 Other obstructive and reflux uropathy: Secondary | ICD-10-CM | POA: Diagnosis not present

## 2021-08-14 LAB — URINALYSIS, ROUTINE W REFLEX MICROSCOPIC
Bilirubin, UA: NEGATIVE
Ketones, UA: NEGATIVE
Leukocytes,UA: NEGATIVE
Nitrite, UA: NEGATIVE
Protein,UA: NEGATIVE
RBC, UA: NEGATIVE
Specific Gravity, UA: 1.01 (ref 1.005–1.030)
Urobilinogen, Ur: 0.2 mg/dL (ref 0.2–1.0)
pH, UA: 6 (ref 5.0–7.5)

## 2021-08-14 NOTE — Progress Notes (Signed)
? ?Assessment: ?1. Elevated PSA   ?2. BPH with obstruction/lower urinary tract symptoms   ? ? ?Plan: ?I reviewed his records from Dr. Hilma Favors.  His PSA is slightly elevated but his free PSA of 41% is suggestive of a benign etiology.  I discussed the role of prostate biopsy and evaluation of elevated PSA.  Given his current results, I think it would be reasonable to follow his PSA.  He and his wife are in agreement with this plan. ?Continue tamsulosin ?Return to office in 6 months. ? ?Chief Complaint:  ?Chief Complaint  ?Patient presents with  ? Elevated PSA  ? ? ?History of Present Illness: ? ?Hunter Peterson is a 76 y.o. year old male who is seen in consultation from Sharilyn Sites, MD for evaluation of elevated PSA. ?PSA results: ?3/23 4.4 with 41.1% free ?No prior PSA results available for review.  He is not aware of previously elevated PSA.  No history of UTIs or prostatitis.  No prior prostate biopsy. ? ?He has a history of lower urinary tract symptoms including frequency, nocturia, and decreased force of stream.  No dysuria or gross hematuria.  He is currently on tamsulosin. ?IPSS = 17 today. ? ? ?Past Medical History:  ?Past Medical History:  ?Diagnosis Date  ? BPH (benign prostatic hypertrophy)   ? H/O ventricular tachycardia 2000  ? pt had cardiac catherization and it was normal.  Never had any more problelms  ? History of adenomatous polyp of colon   ? History of ventricular tachycardia documented wide QRS VT  ? 5/ 2000  during stress test was defibulated and cathed  ? Hyperlipidemia   ? RBBB (right bundle branch block)   ? Spermatocele   ? LEFT  ? Tachyarrhythmia   ? hx recurrent wide complex QRS --  controlled w/ metoprolol  ? Type 2 diabetes mellitus ([place])   ? ? ?Past Surgical History:  ?Past Surgical History:  ?Procedure Laterality Date  ? CARDIAC  EP STUDY  10-20-1998  dr gregg taylor  ? no evidence inducible VT or SVT  ? CARDIAC CATHETERIZATION  5/ 2000   ? normal coronary arteries and LV  preserved  ? CHOLECYSTECTOMY N/A 03/31/2015  ? Procedure: LAPAROSCOPIC CHOLECYSTECTOMY;  Surgeon: Aviva Signs, MD;  Location: [facility];  Service: General;  Laterality: N/A;  ? COLONOSCOPY N/A 10/02/2013  ? Procedure: COLONOSCOPY;  Surgeon: Jamesetta So, MD;  Location: [facility];  Service: Gastroenterology;  Laterality: N/A;  ? SPERMATOCELECTOMY Left 03/28/2014  ? Procedure: LEFT SPERMATOCELECTOMY;  Surgeon: Malka So, MD;  Location: [facility];  Service: Urology;  Laterality: Left;  ? TONSILLECTOMY  as child  ? TRANSTHORACIC ECHOCARDIOGRAM  10-06-1998  ? normal LV/  mild TR and MR  ? ? ?Allergies:  ?No Known Allergies ? ?Family History:  ?Family History  ?Family history unknown: Yes  ? ? ?Social History:  ?Social History  ? ?Tobacco Use  ? Smoking status: Never  ? Smokeless tobacco: Never  ?Substance Use Topics  ? Alcohol use: Yes  ?  Comment: occasional  ? Drug use: No  ? ? ?Review of symptoms:  ?Constitutional:  Negative for unexplained weight loss, night sweats, fever, chills ?ENT:  Negative for nose bleeds, sinus pain, painful swallowing ?CV:  Negative for chest pain, shortness of breath, exercise intolerance, palpitations, loss of consciousness ?Resp:  Negative for cough, wheezing, shortness of breath ?GI:  Negative for nausea, vomiting, diarrhea, bloody stools ?GU:  Positives noted in HPI; otherwise  negative for gross hematuria, dysuria, urinary incontinence ?Neuro:  Negative for seizures, poor balance, limb weakness, slurred speech ?Psych:  Negative for lack of energy, depression, anxiety ?Endocrine:  Negative for polydipsia, polyuria, symptoms of hypoglycemia (dizziness, hunger, sweating) ?Hematologic:  Negative for anemia, purpura, petechia, prolonged or excessive bleeding, use of anticoagulants  ?Allergic:  Negative for difficulty breathing or choking as a result of exposure to anything; no shellfish allergy; no allergic response (rash/itch) to materials, foods ? ?Physical exam: ?BP  (!) 143/89   Pulse 77  ?GENERAL APPEARANCE:  Well appearing, well developed, well nourished, NAD ?HEENT: Atraumatic, Normocephalic, oropharynx clear. ?NECK: Supple without lymphadenopathy or thyromegaly. ?LUNGS: Clear to auscultation bilaterally. ?HEART: Regular Rate and Rhythm without murmurs, gallops, or rubs. ?ABDOMEN: Soft, non-tender, No Masses. ?EXTREMITIES: Moves all extremities well.  Without clubbing, cyanosis, or edema. ?NEUROLOGIC:  Alert and oriented x 3, normal gait, CN II-XII grossly intact.  ?MENTAL STATUS:  Appropriate. ?BACK:  Non-tender to palpation.  No CVAT ?SKIN:  Warm, dry and intact.   ?GU: ?Penis:  uncircumcised ?Meatus: Normal ?Scrotum: normal, no masses ?Testis: normal without masses bilateral ?Epididymis: normal ?Prostate: 40 g, NT, no nodules ?Rectum: Normal tone,  no masses or tenderness ? ? ?Results: ?U/A: dipstick negative ? ?PVR:  81 ml    ? ?

## 2021-09-22 DIAGNOSIS — Z6825 Body mass index (BMI) 25.0-25.9, adult: Secondary | ICD-10-CM | POA: Diagnosis not present

## 2021-09-22 DIAGNOSIS — E663 Overweight: Secondary | ICD-10-CM | POA: Diagnosis not present

## 2021-09-22 DIAGNOSIS — R972 Elevated prostate specific antigen [PSA]: Secondary | ICD-10-CM | POA: Diagnosis not present

## 2021-09-22 DIAGNOSIS — E782 Mixed hyperlipidemia: Secondary | ICD-10-CM | POA: Diagnosis not present

## 2021-09-22 DIAGNOSIS — I10 Essential (primary) hypertension: Secondary | ICD-10-CM | POA: Diagnosis not present

## 2022-02-01 DIAGNOSIS — E119 Type 2 diabetes mellitus without complications: Secondary | ICD-10-CM | POA: Diagnosis not present

## 2022-02-01 DIAGNOSIS — I10 Essential (primary) hypertension: Secondary | ICD-10-CM | POA: Diagnosis not present

## 2022-02-01 DIAGNOSIS — E7849 Other hyperlipidemia: Secondary | ICD-10-CM | POA: Diagnosis not present

## 2022-02-01 DIAGNOSIS — R972 Elevated prostate specific antigen [PSA]: Secondary | ICD-10-CM | POA: Diagnosis not present

## 2022-02-01 DIAGNOSIS — E118 Type 2 diabetes mellitus with unspecified complications: Secondary | ICD-10-CM | POA: Diagnosis not present

## 2022-02-01 DIAGNOSIS — Z1331 Encounter for screening for depression: Secondary | ICD-10-CM | POA: Diagnosis not present

## 2022-02-01 DIAGNOSIS — Z0001 Encounter for general adult medical examination with abnormal findings: Secondary | ICD-10-CM | POA: Diagnosis not present

## 2022-02-01 DIAGNOSIS — Z6826 Body mass index (BMI) 26.0-26.9, adult: Secondary | ICD-10-CM | POA: Diagnosis not present

## 2022-02-01 DIAGNOSIS — E782 Mixed hyperlipidemia: Secondary | ICD-10-CM | POA: Diagnosis not present

## 2022-02-01 DIAGNOSIS — N400 Enlarged prostate without lower urinary tract symptoms: Secondary | ICD-10-CM | POA: Diagnosis not present

## 2022-02-01 DIAGNOSIS — E663 Overweight: Secondary | ICD-10-CM | POA: Diagnosis not present

## 2022-02-04 ENCOUNTER — Ambulatory Visit: Payer: Medicare Other | Admitting: Urology

## 2022-08-20 DIAGNOSIS — E118 Type 2 diabetes mellitus with unspecified complications: Secondary | ICD-10-CM | POA: Diagnosis not present

## 2022-08-20 DIAGNOSIS — I10 Essential (primary) hypertension: Secondary | ICD-10-CM | POA: Diagnosis not present

## 2022-08-20 DIAGNOSIS — N400 Enlarged prostate without lower urinary tract symptoms: Secondary | ICD-10-CM | POA: Diagnosis not present

## 2022-08-20 DIAGNOSIS — E782 Mixed hyperlipidemia: Secondary | ICD-10-CM | POA: Diagnosis not present

## 2022-08-20 DIAGNOSIS — R972 Elevated prostate specific antigen [PSA]: Secondary | ICD-10-CM | POA: Diagnosis not present

## 2022-08-20 DIAGNOSIS — Z86010 Personal history of colonic polyps: Secondary | ICD-10-CM | POA: Diagnosis not present

## 2022-08-20 DIAGNOSIS — Z6825 Body mass index (BMI) 25.0-25.9, adult: Secondary | ICD-10-CM | POA: Diagnosis not present

## 2022-08-20 DIAGNOSIS — E663 Overweight: Secondary | ICD-10-CM | POA: Diagnosis not present

## 2022-11-03 DIAGNOSIS — Z6825 Body mass index (BMI) 25.0-25.9, adult: Secondary | ICD-10-CM | POA: Diagnosis not present

## 2022-11-03 DIAGNOSIS — R42 Dizziness and giddiness: Secondary | ICD-10-CM | POA: Diagnosis not present

## 2022-11-03 DIAGNOSIS — W57XXXA Bitten or stung by nonvenomous insect and other nonvenomous arthropods, initial encounter: Secondary | ICD-10-CM | POA: Diagnosis not present

## 2022-11-03 DIAGNOSIS — I10 Essential (primary) hypertension: Secondary | ICD-10-CM | POA: Diagnosis not present

## 2022-11-03 DIAGNOSIS — E663 Overweight: Secondary | ICD-10-CM | POA: Diagnosis not present

## 2023-02-23 ENCOUNTER — Emergency Department (HOSPITAL_COMMUNITY)
Admission: EM | Admit: 2023-02-23 | Discharge: 2023-02-23 | Disposition: A | Discharge status: Home / Self Care | Payer: Medicare Other

## 2023-02-23 ENCOUNTER — Other Ambulatory Visit: Payer: Self-pay

## 2023-02-23 ENCOUNTER — Encounter (HOSPITAL_COMMUNITY): Payer: Self-pay | Admitting: Emergency Medicine

## 2023-02-23 ENCOUNTER — Emergency Department (HOSPITAL_COMMUNITY): Payer: Medicare Other

## 2023-02-23 DIAGNOSIS — R531 Weakness: Secondary | ICD-10-CM | POA: Insufficient documentation

## 2023-02-23 DIAGNOSIS — Z20822 Contact with and (suspected) exposure to covid-19: Secondary | ICD-10-CM | POA: Insufficient documentation

## 2023-02-23 DIAGNOSIS — R251 Tremor, unspecified: Secondary | ICD-10-CM | POA: Diagnosis not present

## 2023-02-23 DIAGNOSIS — R0602 Shortness of breath: Secondary | ICD-10-CM | POA: Diagnosis not present

## 2023-02-23 DIAGNOSIS — Z79899 Other long term (current) drug therapy: Secondary | ICD-10-CM | POA: Diagnosis not present

## 2023-02-23 DIAGNOSIS — Z7984 Long term (current) use of oral hypoglycemic drugs: Secondary | ICD-10-CM | POA: Diagnosis not present

## 2023-02-23 LAB — CBC
HCT: 43.3 % (ref 39.0–52.0)
Hemoglobin: 15.1 g/dL (ref 13.0–17.0)
MCH: 31.5 pg (ref 26.0–34.0)
MCHC: 34.9 g/dL (ref 30.0–36.0)
MCV: 90.4 fL (ref 80.0–100.0)
Platelets: 242 10*3/uL (ref 150–400)
RBC: 4.79 MIL/uL (ref 4.22–5.81)
RDW: 12.5 % (ref 11.5–15.5)
WBC: 8.3 10*3/uL (ref 4.0–10.5)
nRBC: 0 % (ref 0.0–0.2)

## 2023-02-23 LAB — URINALYSIS, ROUTINE W REFLEX MICROSCOPIC
Bilirubin Urine: NEGATIVE
Glucose, UA: 50 mg/dL — AB
Hgb urine dipstick: NEGATIVE
Ketones, ur: NEGATIVE mg/dL
Leukocytes,Ua: NEGATIVE
Nitrite: NEGATIVE
Protein, ur: NEGATIVE mg/dL
Specific Gravity, Urine: 1.009 (ref 1.005–1.030)
pH: 7 (ref 5.0–8.0)

## 2023-02-23 LAB — RESP PANEL BY RT-PCR (RSV, FLU A&B, COVID)  RVPGX2
Influenza A by PCR: NEGATIVE
Influenza B by PCR: NEGATIVE
Resp Syncytial Virus by PCR: NEGATIVE
SARS Coronavirus 2 by RT PCR: NEGATIVE

## 2023-02-23 LAB — COMPREHENSIVE METABOLIC PANEL
ALT: 24 U/L (ref 0–44)
AST: 20 U/L (ref 15–41)
Albumin: 3.9 g/dL (ref 3.5–5.0)
Alkaline Phosphatase: 55 U/L (ref 38–126)
Anion gap: 10 (ref 5–15)
BUN: 14 mg/dL (ref 8–23)
CO2: 23 mmol/L (ref 22–32)
Calcium: 8.9 mg/dL (ref 8.9–10.3)
Chloride: 101 mmol/L (ref 98–111)
Creatinine, Ser: 0.91 mg/dL (ref 0.61–1.24)
GFR, Estimated: >60 mL/min (ref >60)
Glucose, Bld: 125 mg/dL — ABNORMAL HIGH (ref 70–99)
Potassium: 4 mmol/L (ref 3.5–5.1)
Sodium: 134 mmol/L — ABNORMAL LOW (ref 135–145)
Total Bilirubin: 2 mg/dL — ABNORMAL HIGH (ref 0.3–1.2)
Total Protein: 6.8 g/dL (ref 6.5–8.1)

## 2023-02-23 LAB — CK: Total CK: 65 U/L (ref 49–397)

## 2023-02-23 LAB — LACTIC ACID, PLASMA: Lactic Acid, Venous: 1.3 mmol/L (ref 0.5–1.9)

## 2023-02-23 LAB — CBG MONITORING, ED
Glucose-Capillary: 107 mg/dL — ABNORMAL HIGH (ref 70–99)
Glucose-Capillary: 109 mg/dL — ABNORMAL HIGH (ref 70–99)

## 2023-02-23 NOTE — ED Provider Notes (Signed)
[place] EMERGENCY DEPARTMENT AT [facility] Provider Note   CSN: [id number] Arrival date & time: 02/23/23  0932     History  Chief Complaint  Patient presents with   Weakness    Hunter Peterson is a 77 y.o. male.  Presenting emergency department with generalized weakness.  Has had progressive generalized weakness and bilateral upper extremity tremors for the past couple months.  No headache has had some intermittent vision changes, but none currently.  No chest pain no shortness of breath.  No facial droop, aphasia or unilateral weakness.  No gait or balance disturbances.   Weakness      Home Medications Prior to Admission medications   Medication Sig Start Date End Date Taking? Authorizing Provider  enalapril (VASOTEC) 2.5 MG tablet Take 2.5 mg by mouth every morning.     [provider]  metFORMIN (GLUCOPHAGE) 500 MG tablet Take 500 mg by mouth 2 (two) times daily with a meal.    [provider]  metoprolol tartrate (LOPRESSOR) 25 MG tablet Take 25 mg by mouth 2 (two) times daily.    [provider]  Saw Palmetto, Serenoa repens, (SAW PALMETTO PO) Take 1 tablet by mouth 2 (two) times daily.    [provider]  tamsulosin (FLOMAX) 0.4 MG CAPS capsule Take 0.4 mg by mouth every morning.     [provider]  zolpidem (AMBIEN) 10 MG tablet Take 10 mg by mouth at bedtime as needed for sleep.    [provider]      Allergies    Patient has no known allergies.    Review of Systems   Review of Systems  Neurological:  Positive for weakness.    Physical Exam Updated Vital Signs BP 122/82   Pulse 99   Temp 98.1 F (36.7 C) (Oral)   Resp (!) 23   Ht 5\' 11"  (1.803 m)   Wt 75.8 kg   SpO2 95%   BMI 23.29 kg/m  Physical Exam Vitals and nursing note reviewed.  HENT:     Head: Normocephalic and atraumatic.     Nose: Nose normal.     Mouth/Throat:     Mouth: Mucous membranes are moist.  Eyes:      Conjunctiva/sclera: Conjunctivae normal.  Cardiovascular:     Rate and Rhythm: Normal rate and regular rhythm.  Pulmonary:     Effort: Pulmonary effort is normal.  Abdominal:     General: Abdomen is flat. There is no distension.     Tenderness: There is no abdominal tenderness. There is no guarding or rebound.  Musculoskeletal:        General: Normal range of motion.  Skin:    General: Skin is warm.     Capillary Refill: Capillary refill takes less than 2 seconds.  Neurological:     General: No focal deficit present.     Mental Status: He is alert and oriented to person, place, and time.     Comments: Patient seemingly has a pill-rolling tremor in upper extremities.  Does not change with intention.  Seemingly has cogwheel rigidity.  No facial droop, cranial nerves intact.  No aphasia.  Equal strength and sensation in all extremities   Psychiatric:        Mood and Affect: Mood normal.        Behavior: Behavior normal.     ED Results / Procedures / Treatments   Labs (all labs ordered are listed, but only abnormal results are displayed)  Labs Reviewed  URINALYSIS, ROUTINE W REFLEX MICROSCOPIC - Abnormal; Notable for the following components:      Result Value   Glucose, UA 50 (*)    All other components within normal limits  COMPREHENSIVE METABOLIC PANEL - Abnormal; Notable for the following components:   Sodium 134 (*)    Glucose, Bld 125 (*)    Total Bilirubin 2.0 (*)    All other components within normal limits  CBG MONITORING, ED - Abnormal; Notable for the following components:   Glucose-Capillary 107 (*)    All other components within normal limits  CBG MONITORING, ED - Abnormal; Notable for the following components:   Glucose-Capillary 109 (*)    All other components within normal limits  RESP PANEL BY RT-PCR (RSV, FLU A&B, COVID)  RVPGX2  CBC  CK  LACTIC ACID, PLASMA    EKG EKG Interpretation Date/Time:  Wednesday February 23 2023 10:46:00 EDT Ventricular Rate:   83 PR Interval:    QRS Duration:  135 QT Interval:  374 QTC Calculation: 440 R Axis:   -46  Text Interpretation: Atrial fibrillation RBBB and LAFB Minimal ST elevation, lateral leads Confirmed by Estanislado Pandy [phone]) on 02/23/2023 10:56:34 AM  Radiology DG Chest Portable 1 View  Result Date: 02/23/2023 CLINICAL DATA:  Shortness of breath EXAM: PORTABLE CHEST 1 VIEW COMPARISON:  Chest radiograph dated 01/09/2007 FINDINGS: Normal lung volumes. No focal consolidations. No pleural effusion or pneumothorax. The heart size and mediastinal contours are within normal limits. No acute osseous abnormality. IMPRESSION: No active disease. Electronically Signed   By: Agustin Cree M.D.   On: 02/23/2023 11:56    Procedures Procedures    Medications Ordered in ED Medications - No data to display  ED Course/ Medical Decision Making/ A&P Clinical Course as of 02/23/23 1641  Wed Feb 23, 2023  1318 Case discussed with neurology on-call for.  They report with progressive symptoms with no focality or sensorium changes that would likely not benefit from imaging at this time and follow-up outpatient.  Discussed results with patient and discussion.  He states that he is ready to go and feeling improved and would like to be discharged at this time. [TY]    Clinical Course User Index [TY] Hunter Spikes, DO                                 Medical Decision Making Well-appearing 77 year old male presenting emergency department for tremors and generalized weakness.  Afebrile, hypertensive.  Physical exam with resting tremors, but no localizing deficits.  Broad workup to evaluate for his generalized weakness largely reassuring.  No fever or tachycardia or leukocytosis to suggest systemic infection.  No metabolic derangements.  Mild elevation in T. bili, but no abdominal pain soft benign abdomen.  CK not elevated; rhabdo unlikely.  Lactate 1.3 unlikely seizure.  Given generalized weakness concern for possible viral  etiology however flu/COVID was negative.  UA negative as well.  Chest x-ray without pneumonia or pneumothorax.  ECG normal sinus rhythm without ST segment changes to indicate ischemia.  After observed in the emergency department patient symptoms seemingly improved and he feels ready to go.  Case discussed with neurology; see ED course.  Discharged in stable condition.  Will give follow-up with neurology.  Amount and/or Complexity of Data Reviewed Independent Historian:     Details: Family notes some urinary frequency External Data Reviewed:     Details: Recent  medication change with ezetimibe Labs: ordered. Radiology: ordered.    Details: Considered advanced into the head, however with no localized deficits, stable vitals and labs in conjunction with neurology consult.  Patient unlikely benefit from imaging at this time.  Can be deferred palpation.  Risk Decision regarding hospitalization.          Final Clinical Impression(s) / ED Diagnoses Final diagnoses:  Generalized weakness  Tremor    Rx / DC Orders ED Discharge Orders     None         Hunter Spikes, DO 02/23/23 1642

## 2023-02-23 NOTE — Discharge Instructions (Signed)
Please follow-up with your primary doctor, we also given the number to neurology.  Please call and schedule appointment.  You are immediately felt fevers, chills, headache, facial droop, unilateral weakness, chest pain, shortness of breath or any new or worsening symptoms that are concerning to you.

## 2023-02-23 NOTE — ED Notes (Signed)
See triage notes. A/o. Nad. Mild gen weakness noted. Pt moving all extremities. Denies trouble swallowing. Color wnl. Non diaphoretic.

## 2023-02-23 NOTE — ED Notes (Signed)
Snack given and edp aware pt is ready to leave now.

## 2023-02-23 NOTE — ED Triage Notes (Signed)
Generalized weakness x 2 months. Pt states it has been getting worse and today had a hard time getting out of bed. Also complains of intermittent blurred vision and body tremors. Denies any pain.

## 2023-03-09 DIAGNOSIS — Z0001 Encounter for general adult medical examination with abnormal findings: Secondary | ICD-10-CM | POA: Diagnosis not present

## 2023-03-09 DIAGNOSIS — I10 Essential (primary) hypertension: Secondary | ICD-10-CM | POA: Diagnosis not present

## 2023-03-09 DIAGNOSIS — E559 Vitamin D deficiency, unspecified: Secondary | ICD-10-CM | POA: Diagnosis not present

## 2023-03-09 DIAGNOSIS — Z6825 Body mass index (BMI) 25.0-25.9, adult: Secondary | ICD-10-CM | POA: Diagnosis not present

## 2023-03-09 DIAGNOSIS — R972 Elevated prostate specific antigen [PSA]: Secondary | ICD-10-CM | POA: Diagnosis not present

## 2023-03-09 DIAGNOSIS — E1159 Type 2 diabetes mellitus with other circulatory complications: Secondary | ICD-10-CM | POA: Diagnosis not present

## 2023-03-09 DIAGNOSIS — E782 Mixed hyperlipidemia: Secondary | ICD-10-CM | POA: Diagnosis not present

## 2023-03-09 DIAGNOSIS — E663 Overweight: Secondary | ICD-10-CM | POA: Diagnosis not present

## 2023-03-09 DIAGNOSIS — E119 Type 2 diabetes mellitus without complications: Secondary | ICD-10-CM | POA: Diagnosis not present

## 2023-03-09 DIAGNOSIS — R42 Dizziness and giddiness: Secondary | ICD-10-CM | POA: Diagnosis not present

## 2023-03-15 ENCOUNTER — Encounter: Payer: Self-pay | Admitting: Neurology

## 2023-03-29 NOTE — Progress Notes (Unsigned)
Assessment/Plan:  1.  Parkinsonism  -Likely idiopathic Parkinson's disease, but patient is far more rigid than I would expect for the timeline he gives.  He reports that this all started in June, but patient with moderate to severe rigidity.  My guess is that this actually started long before then, but that he did not necessarily notice the issues.  -Patient does have some mild left facial droop and I think we should go ahead and do an MRI of the brain.  He was agreeable.  -Patient also has right foot chorea.  Patient's wife states that he has actually had that since they have been married (over 50 years).  She reports that he has always been "fidgety."  It is certainly possible that he has benign hereditary chorea, but there is no family history of such.  Therefore, I really would like to make sure that there is nothing else going on.  Lab work up will include:  -ceruloplasmin, copper, TSH,  ferritin, sedimentation rate, ANA, antiphospholipid antibody, lupus anticoagulant, RPR, B12, antigliadin antibody.  Because it is not new, we decided to hold off on any HD labs  -We will do skin biopsy for alpha-synuclein.  -We will go ahead and start levodopa and work to 1 tablet 3 times per day.  2.  Sialorrhea  -I also report that this is not new and is virtually lifelong, but this is certainly commonly associated with PD.  He carries a drool cloth.  We can always consider Botox in the future.  3.  Constipation  -he is actually only on metformin to help constipation.  He states that his primary care physician had told him to go off of it because he no longer needs it for the diabetes, but he got significantly constipated when he went off of it.  We talked about constipation as it relates to Parkinson's disease.  We talked about increased water intake.  4.  Hyperreflexia  -Likely physiologic.  Patient denies any neck or back pain.  Does not appear to be spastic.  Decided to hold off on any spine imaging,  but may consider in the future. Subjective:   Hunter Peterson was seen today in the movement disorders clinic for neurologic consultation at the request of Coral Spikes, DO.  The consultation is for the evaluation of tremor.  This patient is accompanied in the office by his spouse who supplements the history. Patient was seen in the emergency room on October 9 and was sent from the emergency room for pill-rolling tremor and cogwheel rigidity.  Medical records were reviewed.  Pt states that he got tick bites in June and he was tx with doxy and he kept getting "weaker."  This means he was having trouble getting out of the chair and trouble getting out of the bed.  Its been a bit better over the last 1.5 weeks after he started on Vit D  Tremor: Yes.   , worse in the AM and he feels like its from being cold natured.    At rest or with activation?  rest  Fam hx of tremor?  No.  Located where?  R hand and L index finger (he is R hand dominant)  Affected by caffeine:  No.  Affected by alcohol:  doesn't drink alcohol  Affected by stress:  Yes.    Spills soup if on spoon:  may or may not   Other Specific Symptoms:  Voice: yes, per wife (softer) Sleep: he has chronic  insomnia  Vivid Dreams:  No.  Acting out dreams:  No. Wet Pillows: Yes.   Postural symptoms:  Yes.  , "Its because I am weak"  Falls?  No. Bradykinesia symptoms: slow movements, drooling while awake, and difficulty getting out of a chair Loss of smell:  Yes.  , chronic Loss of taste:  No. Urinary Incontinence:  has nighttime incontinence and nocturia Difficulty Swallowing:  No. Handwriting, micrographia: Yes.   Trouble with ADL's:  Yes.   (Wife helps x 1 months)  Trouble buttoning clothing: Yes.   Depression:  No. But admits to anxiety Memory changes:  No. Hallucinations:  No.  visual distortions: No. N/V:  No. Lightheaded:  No.  Syncope: No. Diplopia:  No. But he has blurry vision Dyskinesia:  No.  Neuroimaging of  the brain has not previously been performed.    ALLERGIES:  No Known Allergies  CURRENT MEDICATIONS:  Current Outpatient Medications  Medication Instructions   enalapril (VASOTEC) 2.5 mg, Oral, Every morning   metFORMIN (GLUCOPHAGE) 500 mg, 2 times daily with meals   metoprolol tartrate (LOPRESSOR) 25 mg, Oral,  Once   Saw Palmetto, Serenoa repens, (SAW PALMETTO PO) 1 tablet, Oral, 2 times daily   tamsulosin (FLOMAX) 0.4 mg, Oral, Every morning    Objective:   PHYSICAL EXAMINATION:    VITALS:   Vitals:   03/31/23 1242  BP: (!) 150/70  Pulse: 95  SpO2: 96%  Weight: 170 lb 3.2 oz (77.2 kg)  Height: 5\' 11"  (1.803 m)    GEN:  The patient appears stated age and is in NAD. HEENT:  Normocephalic, atraumatic.  The mucous membranes are moist. The superficial temporal arteries are without ropiness or tenderness. CV:  RRR Lungs:  CTAB Neck/HEME:  There are no carotid bruits bilaterally.  Neurological examination:  Orientation: The patient is alert and oriented x3.  Cranial nerves: There is mild L facial droop.  There is facial hypopmimia.  Patient has drooling with drool cloth present.  Extraocular muscles are intact. The visual fields are full to confrontational testing. The speech is fluent and clear. Soft palate rises symmetrically and there is no tongue deviation. Hearing is intact to conversational tone. Sensation: Sensation is intact to light touch throughout (facial, trunk, extremities). Vibration is intact at the bilateral big toe. There is no extinction with double simultaneous stimulation.  Motor: Strength is 5/5 in the bilateral upper and lower extremities.   Shoulder shrug is equal and symmetric.  There is no pronator drift. Deep tendon reflexes: Deep tendon reflexes are 3/4 at the bilateral biceps, triceps, brachioradialis, patella with cross adductor reflexes and achilles. Plantar responses are downgoing bilaterally.  Movement examination: Tone: There is mod increased  tone in the bilateral UE.  There is severe increased tone in the RLE and mild to mod increased tone in the LLE Abnormal movements: there is mild rest tremor, intermittent, in the L>RUE.  There is chorea of the R foot Coordination:  There is significant decremation with RAM's, with any form of RAMS, including alternating supination and pronation of the forearm, hand opening and closing, finger taps, L>>R Gait and Station: The patient has difficulty arising out of a deep-seated chair without the use of the hands and he is unable to do this. The patient's stride length is decreased with no arm swing bilaterally.   I have reviewed and interpreted the following labs independently   Chemistry      Component Value Date/Time   NA 134 (L) 02/23/2023 1025  K 4.0 02/23/2023 1025   CL 101 02/23/2023 1025   CO2 23 02/23/2023 1025   BUN 14 02/23/2023 1025   CREATININE 0.91 02/23/2023 1025      Component Value Date/Time   CALCIUM 8.9 02/23/2023 1025   ALKPHOS 55 02/23/2023 1025   AST 20 02/23/2023 1025   ALT 24 02/23/2023 1025   BILITOT 2.0 (H) 02/23/2023 1025       Lab Results  Component Value Date   WBC 8.3 02/23/2023   HGB 15.1 02/23/2023   HCT 43.3 02/23/2023   MCV 90.4 02/23/2023   PLT 242 02/23/2023      Total time spent on today's visit was 65 minutes, including both face-to-face time and nonface-to-face time.  Time included that spent on review of records (prior notes available to me/labs/imaging if pertinent), discussing treatment and goals, answering patient's questions and coordinating care.  Cc:  Assunta Found, MD

## 2023-03-31 ENCOUNTER — Encounter: Payer: Self-pay | Admitting: Neurology

## 2023-03-31 ENCOUNTER — Ambulatory Visit: Payer: Medicare Other | Admitting: Neurology

## 2023-03-31 ENCOUNTER — Other Ambulatory Visit (INDEPENDENT_AMBULATORY_CARE_PROVIDER_SITE_OTHER): Payer: Medicare Other

## 2023-03-31 VITALS — BP 150/70 | HR 95 | Ht 71.0 in | Wt 170.2 lb

## 2023-03-31 DIAGNOSIS — R29898 Other symptoms and signs involving the musculoskeletal system: Secondary | ICD-10-CM

## 2023-03-31 DIAGNOSIS — E538 Deficiency of other specified B group vitamins: Secondary | ICD-10-CM | POA: Diagnosis not present

## 2023-03-31 DIAGNOSIS — G20C Parkinsonism, unspecified: Secondary | ICD-10-CM | POA: Diagnosis not present

## 2023-03-31 DIAGNOSIS — G255 Other chorea: Secondary | ICD-10-CM | POA: Diagnosis not present

## 2023-03-31 DIAGNOSIS — R29810 Facial weakness: Secondary | ICD-10-CM

## 2023-03-31 DIAGNOSIS — R251 Tremor, unspecified: Secondary | ICD-10-CM

## 2023-03-31 DIAGNOSIS — G20A1 Parkinson's disease without dyskinesia, without mention of fluctuations: Secondary | ICD-10-CM

## 2023-03-31 DIAGNOSIS — R6889 Other general symptoms and signs: Secondary | ICD-10-CM

## 2023-03-31 DIAGNOSIS — E611 Iron deficiency: Secondary | ICD-10-CM

## 2023-03-31 DIAGNOSIS — Z5181 Encounter for therapeutic drug level monitoring: Secondary | ICD-10-CM | POA: Diagnosis not present

## 2023-03-31 MED ORDER — CARBIDOPA-LEVODOPA 25-100 MG PO TABS: 1.0000 | ORAL_TABLET | Freq: Three times a day (TID) | ORAL | 1 refills | Status: DC

## 2023-03-31 NOTE — Patient Instructions (Addendum)
Start Carbidopa Levodopa as follows: Take 1/2 tablet three times daily, at least 30 minutes before meals (approximately 8am/noon/4pm), for one week Then take 1/2 tablet in the morning, 1/2 tablet in the afternoon, 1 tablet in the evening, at least 30 minutes before meals, for one week Then take 1/2 tablet in the morning, 1 tablet in the afternoon, 1 tablet in the evening, at least 30 minutes before meals, for one week Then take 1 tablet three times daily at 8am/noon/4pm, at least 30 minutes before meals   As a reminder, carbidopa/levodopa can be taken at the same time as a carbohydrate, but we like to have you take your pill either 30 minutes before a protein source or 1 hour after as protein can interfere with carbidopa/levodopa absorption.  Labs today [address] Mri at [facility]

## 2023-04-01 LAB — TSH: TSH: 1.72 u[IU]/mL (ref 0.35–5.50)

## 2023-04-01 LAB — FERRITIN: Ferritin: 35.5 ng/mL (ref 22.0–322.0)

## 2023-04-01 LAB — VITAMIN B12: Vitamin B-12: 133 pg/mL — ABNORMAL LOW (ref 211–911)

## 2023-04-01 LAB — SEDIMENTATION RATE: Sed Rate: 4 mm/h (ref 0–20)

## 2023-04-03 ENCOUNTER — Ambulatory Visit (HOSPITAL_COMMUNITY)
Admission: RE | Admit: 2023-04-03 | Discharge: 2023-04-03 | Disposition: A | Discharge status: Home / Self Care | Payer: Medicare Other | Source: Ambulatory Visit | Attending: Neurology | Admitting: Neurology

## 2023-04-03 DIAGNOSIS — R251 Tremor, unspecified: Secondary | ICD-10-CM | POA: Diagnosis not present

## 2023-04-03 DIAGNOSIS — I6782 Cerebral ischemia: Secondary | ICD-10-CM | POA: Diagnosis not present

## 2023-04-03 DIAGNOSIS — R29810 Facial weakness: Secondary | ICD-10-CM | POA: Insufficient documentation

## 2023-04-03 DIAGNOSIS — G939 Disorder of brain, unspecified: Secondary | ICD-10-CM | POA: Diagnosis not present

## 2023-04-04 ENCOUNTER — Encounter: Payer: Self-pay | Admitting: Neurology

## 2023-04-04 ENCOUNTER — Ambulatory Visit (INDEPENDENT_AMBULATORY_CARE_PROVIDER_SITE_OTHER): Payer: Medicare Other | Admitting: Neurology

## 2023-04-04 VITALS — BP 130/70 | HR 96

## 2023-04-04 DIAGNOSIS — D1800 Hemangioma unspecified site: Secondary | ICD-10-CM | POA: Diagnosis not present

## 2023-04-04 DIAGNOSIS — G255 Other chorea: Secondary | ICD-10-CM | POA: Diagnosis not present

## 2023-04-04 DIAGNOSIS — G20C Parkinsonism, unspecified: Secondary | ICD-10-CM | POA: Diagnosis not present

## 2023-04-04 NOTE — Procedures (Signed)
Punch Biopsy Procedure Note  Preprocedure Diagnosis: Bradykinesia; Tremor;   Postprocedure Diagnosis: same  Locations: Site 1: Left posterior cervical; Site 2: above, left knee;  Site 3: above, left foot  Indications: r/o alpha synucleinopathy  Anesthesia: 3 mL Lidocaine 1% without epinephrine without added sodium bicarbonate  Procedure Details Patient informed of the risks (including but not limited to bleeding, pain, infection, scar and infection) and benefits of the procedure.  Informed consent obtained.  The areas which were chosen for biopsy, as above, and surrounding areas were given a sterile prep using betadyne and draped in the usual sterile fashion. The skin was then stretched perpendicular to the skin tension lines and sample removed using the 3 mm punch. Pressure applied, hemostasis achieved.   Dressing applied. The specimen(s) was sent for pathologic examination. The patient tolerated the procedure well.  Estimated Blood Loss: 0 ml  Condition: Stable  Complications: none.  Plan: 1. Instructed to keep the wound dry and covered for 24-48h and clean thereafter. 2. Warning signs of infection were reviewed.

## 2023-04-04 NOTE — Progress Notes (Signed)
Assessment/Plan:   1.  Parkinsonism             -Probable Parkinson's disease, but patient is far more rigid than I would expect for the timeline he gives.  He reports that this all started in June, but patient with moderate to severe rigidity.  My guess is that this actually started long before then, but that he did not necessarily notice the issues.  -Skin biopsy done today.             -Does have bilateral caudate infarcts, which could, in theory, cause some degree of parkinsonism, but more likely to cause chorea.             -Just started levodopa last week.  He thinks that is helping already.    2.  Right foot chorea  -They report he has had a history of lifelong "fidgetiness", but the current on the right foot is really focal, and may be due to the caudate infarcts, particularly the left caudate infarct.  Thus far, lab work has been unremarkable.    3..  Sialorrhea             -I also report that this is not new and is virtually lifelong, but this is certainly commonly associated with PD.  He carries a drool cloth.  We can always consider Botox in the future.    4..  Constipation             -he is actually only on metformin to help constipation.  He states that his primary care physician had told him to go off of it because he no longer needs it for the diabetes, but he got significantly constipated when he went off of it.  We talked about constipation as it relates to Parkinson's disease.  We talked about increased water intake.   5.  Cerebellar cavernoma, right  -Patient has evidence of 10 mm cerebellar cavernoma with evidence of prior bleed, hemosiderin deposits.  I wonder if this happened back in June, when patient reported that he began to feel so weak and he attributed it to a tick bite.  Nonetheless, I am going to send him to surgery given size of l lesion and evidence of prior bleed.  Subjective:   Hunter Peterson was seen today in follow up for his skin biopsy, but a new  issue arose that I wanted to talk to him about, separate from the skin biopsy.  Patient with wife who supplements history.  He had an MRI of the brain just done yesterday and it was resulted to me this morning.  There was a 10 mm cavernoma in the right cerebellar hemisphere, with evidence of prior bleed/hemosiderin deposit.  There is also evidence of chronic lacunes in the bilateral caudate.  Separately, patient reports that he does think that the small dose of levodopa he has started is helping.  MRI images were personally reviewed and reviewed with radiology.   ALLERGIES:  No Known Allergies  CURRENT MEDICATIONS:  Current Meds  Medication Sig   carbidopa-levodopa (SINEMET IR) 25-100 MG tablet Take 1 tablet by mouth 3 (three) times daily. 8am/noon/4pm   enalapril (VASOTEC) 2.5 MG tablet Take 2.5 mg by mouth every morning.    metFORMIN (GLUCOPHAGE) 500 MG tablet Take 500 mg by mouth 2 (two) times daily with a meal.   metoprolol tartrate (LOPRESSOR) 25 MG tablet Take 25 mg by mouth once.   Saw Palmetto, Serenoa repens, (SAW PALMETTO  PO) Take 1 tablet by mouth 2 (two) times daily.   tamsulosin (FLOMAX) 0.4 MG CAPS capsule Take 0.4 mg by mouth every morning.      Objective:   PHYSICAL EXAMINATION:    VITALS:   Vitals:   04/04/23 1355  BP: 130/70  Pulse: 96  SpO2: 95%    GEN:  The patient appears stated age and is in NAD.   Neurological examination:  Orientation: The patient is alert and oriented x3. Cranial nerves: There is good facial symmetry with facial hypomimia. The speech is fluent and clear.    I have reviewed and interpreted the following labs independently    Chemistry      Component Value Date/Time   NA 134 (L) 02/23/2023 1025   K 4.0 02/23/2023 1025   CL 101 02/23/2023 1025   CO2 23 02/23/2023 1025   BUN 14 02/23/2023 1025   CREATININE 0.91 02/23/2023 1025      Component Value Date/Time   CALCIUM 8.9 02/23/2023 1025   ALKPHOS 55 02/23/2023 1025   AST 20  02/23/2023 1025   ALT 24 02/23/2023 1025   BILITOT 2.0 (H) 02/23/2023 1025       Lab Results  Component Value Date   WBC 8.3 02/23/2023   HGB 15.1 02/23/2023   HCT 43.3 02/23/2023   MCV 90.4 02/23/2023   PLT 242 02/23/2023    Lab Results  Component Value Date   TSH 1.72 03/31/2023      Cc:  Assunta Found, MD

## 2023-04-07 LAB — LUPUS ANTICOAGULANT AND ANTIPHOSPHOLIPID CONFIRM W/CONSULT
Anticardiolipin IgG: <2 [GPL'U]/mL (ref <20.0)
Anticardiolipin IgM: <2 [MPL'U]/mL (ref <20.0)
Beta-2 Glyco 1 IgM: <2 U/mL (ref <20.0)
Beta-2 Glyco I IgG: <2 U/mL (ref <20.0)
PTT-LA Screen: 36 s (ref <=40)
dRVVT: 33 s (ref <=45)

## 2023-04-07 LAB — COPPER, SERUM: Copper: 72 ug/dL (ref 70–175)

## 2023-04-07 LAB — CERULOPLASMIN: Ceruloplasmin: 18 mg/dL (ref 14–30)

## 2023-04-08 LAB — ANTIPHOSPHOLIPID SYNDROME COMP
APTT: 24.8 s
Anticardiolipin Ab, IgA: <10 [APL'U]
Anticardiolipin Ab, IgG: <10 [GPL'U]
Anticardiolipin Ab, IgM: <10 [MPL'U]
Antiphosphatidylserine IgG: 0 {GPS'U}
Antiphosphatidylserine IgM: 0 {MPS'U}
Antiprothrombin Antibody, IgG: 1 G units
Beta-2 Glycoprotein I, IgA: <10 SAU
Beta-2 Glycoprotein I, IgG: <10 SGU
Beta-2 Glycoprotein I, IgM: <10 SMU
DRVVT Screen Seconds: 32.5 s
Hexagonal Phospholipid Neutral: 4 s
Platelet Neutralization: 0 s

## 2023-04-08 LAB — GLIA (IGA/G) + TTG IGA
Antigliadin Abs, IgA: 4 U (ref 0–19)
Gliadin IgG: 3 U (ref 0–19)
Transglutaminase IgA: <2 U/mL (ref 0–3)

## 2023-04-08 LAB — ANA W/REFLEX: Anti Nuclear Antibody (ANA): NEGATIVE

## 2023-04-27 DIAGNOSIS — G20C Parkinsonism, unspecified: Secondary | ICD-10-CM | POA: Diagnosis not present

## 2023-04-28 ENCOUNTER — Telehealth: Payer: Self-pay | Admitting: Neurology

## 2023-04-28 NOTE — Telephone Encounter (Signed)
Patient states that we were sending him to the neurosurgery place and he has not heard anything from them and wants to check the status.  He would also like to know the results of the Skin Biopsy if we have gotten them back

## 2023-04-28 NOTE — Telephone Encounter (Signed)
I was just given skin biopsy results today.  Pts skin biopsy came back.  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  No evidence of small fiber neuropathy 3.  No evidence of amyloid deposition within the cutaneous nerves  Please call pt/family and let them know that there was evidence of the pathologic hallmark feature we see in Parkinsons Disease, which is what we suspected.  I just started him on carbidopa/levodopa and he should stay on that medication.  It does not look like he has a future f/u and he should be seen in the next 4 months or so

## 2023-04-28 NOTE — Telephone Encounter (Signed)
Called patient and gave results sending up front for scheduling in 4 months

## 2023-08-29 NOTE — Progress Notes (Unsigned)
 Assessment/Plan:  1.  Parkinsons disease, diagnosed November, 2024, but already had very significant rigidity and likely had the disease for quite some time             -Skin biopsy done positive for alpha synuclein in 03/2023.             -Does have bilateral caudate infarcts, which could, in theory, cause some degree of parkinsonism, but more likely to cause chorea.             -continue carbidopa/levodopa 25/100 tid  -We discussed that it used to be thought that levodopa would increase risk of melanoma but now it is believed that Parkinsons itself likely increases risk of melanoma. he is to get regular skin checks.     2.  Right foot chorea  -this started long prior to the addition of levodopa             -They report he has had a history of lifelong "fidgetiness", but the current on the right foot is really focal, and may be due to the caudate infarcts, particularly the left caudate infarct.  Thus far, lab work has been unremarkable.    3..  Sialorrhea             - They had reported that this was virtually lifelong, but he got markedly better when we added levodopa.  He is no longer carrying a drool cloth.  We can always consider Botox in the future.  He and I discussed this today.    4..  Constipation             -he is actually only on metformin to help constipation.  He states that his primary care physician had told him to go off of it because he no longer needs it for the diabetes, but he got significantly constipated when he went off of it.  We talked about constipation as it relates to Parkinson's disease.  We talked about increased water intake.   5.  Cerebellar cavernoma, right             -Patient has evidence of 10 mm cerebellar cavernoma with evidence of prior bleed, hemosiderin deposits.  I wonder if this happened lastJune, when patient reported that he began to feel so weak and he attributed it to a tick bite.  Nonetheless, I wanted to get a neurosurgeon opinion given size of  l lesion and evidence of prior bleed.  He doesn't want to be referred though and we will continue to discuss this Subjective:   Hunter Peterson was seen today in the movement disorders clinic for follow-up for Parkinson's disease.  We did a skin biopsy after our last visit which was positive for alpha-synuclein.  There was no evidence of small fiber neuropathy or amyloid deposition in the cutaneous nerves.  He did have an MRI with evidence of a right cerebellar cavernoma and we did send him to neurosurgery for a consult.  They note today that they decided not to proceed.  The levodopa is really helping but its making him sleepy.  "I'm willing to tolerate it."  He reports drooling is better.  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  He is overall quite happy with the medication and how he is functioning now.  ALLERGIES:  No Known Allergies  CURRENT MEDICATIONS:  Current Outpatient Medications  Medication Instructions   carbidopa-levodopa (SINEMET IR) 25-100 MG tablet 1 tablet,  Oral, 3 times daily, 8am/noon/4pm   enalapril (VASOTEC) 2.5 mg, Every morning   metFORMIN (GLUCOPHAGE) 500 mg, 2 times daily with meals   metoprolol tartrate (LOPRESSOR) 25 mg,  Once   Saw Palmetto, Serenoa repens, (SAW PALMETTO PO) 1 tablet, 2 times daily   tamsulosin (FLOMAX) 0.4 mg, Every morning    Objective:   PHYSICAL EXAMINATION:    VITALS:   Vitals:   08/30/23 1441  BP: (!) 142/84  Pulse: 63  SpO2: 97%     GEN:  The patient appears stated age and is in NAD. HEENT:  Normocephalic, atraumatic.  The mucous membranes are moist. The superficial temporal arteries are without ropiness or tenderness. CV:  RRR Lungs:  CTAB Neck/HEME:  There are no carotid bruits bilaterally.  Neurological examination:  Orientation: The patient is alert and oriented x3.  Cranial nerves: There is mild L facial droop.  There is facial hypopmimia.  Patient has drooling with drool cloth  present.  Extraocular muscles are intact. The visual fields are full to confrontational testing. The speech is fluent and clear. Soft palate rises symmetrically and there is no tongue deviation. Hearing is intact to conversational tone. Sensation: Sensation is intact to light touch throughout (facial, trunk, extremities). Vibration is intact at the bilateral big toe. There is no extinction with double simultaneous stimulation.  Motor: Strength is at least antigravity x 4   Movement examination: Tone: There is nl tone today Abnormal movements: there is rest tremor in the pointer finger;  there is R leg dyskinesia Coordination:  There is min decremation with finger taps on the L.  All other RAMs are normal.  This is a marked improvement Gait and Station: The patient has no difficulty arising out of a deep-seated chair without the use of the hands (improved).  The patient's stride length is good with no arm swing bilaterally.  This is marked improvement I have reviewed and interpreted the following labs independently   Chemistry      Component Value Date/Time   NA 134 (L) 02/23/2023 1025   K 4.0 02/23/2023 1025   CL 101 02/23/2023 1025   CO2 23 02/23/2023 1025   BUN 14 02/23/2023 1025   CREATININE 0.91 02/23/2023 1025      Component Value Date/Time   CALCIUM 8.9 02/23/2023 1025   ALKPHOS 55 02/23/2023 1025   AST 20 02/23/2023 1025   ALT 24 02/23/2023 1025   BILITOT 2.0 (H) 02/23/2023 1025       Lab Results  Component Value Date   WBC 8.3 02/23/2023   HGB 15.1 02/23/2023   HCT 43.3 02/23/2023   MCV 90.4 02/23/2023   PLT 242 02/23/2023      Total time spent on today's visit was 30 minutes, including both face-to-face time and nonface-to-face time.  Time included that spent on review of records (prior notes available to me/labs/imaging if pertinent), discussing treatment and goals, answering patient's questions and coordinating care.  Cc:  Minus Amel, MD

## 2023-08-30 ENCOUNTER — Ambulatory Visit (INDEPENDENT_AMBULATORY_CARE_PROVIDER_SITE_OTHER): Payer: Medicare Other | Admitting: Neurology

## 2023-08-30 ENCOUNTER — Encounter: Payer: Self-pay | Admitting: Neurology

## 2023-08-30 VITALS — BP 142/84 | HR 63

## 2023-08-30 DIAGNOSIS — G20A1 Parkinson's disease without dyskinesia, without mention of fluctuations: Secondary | ICD-10-CM | POA: Diagnosis not present

## 2023-08-30 DIAGNOSIS — D1800 Hemangioma unspecified site: Secondary | ICD-10-CM | POA: Diagnosis not present

## 2023-08-30 DIAGNOSIS — G255 Other chorea: Secondary | ICD-10-CM

## 2023-08-30 MED ORDER — CARBIDOPA-LEVODOPA 25-100 MG PO TABS: 1.0000 | ORAL_TABLET | Freq: Three times a day (TID) | ORAL | 1 refills | Status: DC

## 2023-08-30 NOTE — Patient Instructions (Addendum)
 We will hold on Sept 26 we will hold a Parkinsons Disease educational FREE symposium at the [facility].  We would love to see you!  As we discussed, it used to be thought that levodopa would increase risk of melanoma but now it is believed that Parkinsons itself likely increases risk of melanoma. I recommend yearly skin checks with a board certified dermatologist.    [facility] Locations: [facility] [address] [place], [place] [address] [phone]  [facility] Address: [address] [place], [place], [place] [address] Phone: [phone]  [facility] [address] [place], [place], [place] Phone: [phone]  [facility] [address], [place], [place] [address] Phone: [phone]  [place]: [facility] [address] [place], [place] [address] Phone: [phone]  [facility] [address] [place], [place], [place] [address] Phone: [phone]  [place]: [facility] [address] [place], [place] [address] Phone: [phone]

## 2023-09-28 ENCOUNTER — Encounter: Payer: Self-pay | Admitting: Neurology

## 2023-10-11 ENCOUNTER — Encounter: Payer: Self-pay | Admitting: Neurology

## 2023-11-02 DIAGNOSIS — I10 Essential (primary) hypertension: Secondary | ICD-10-CM | POA: Diagnosis not present

## 2023-11-02 DIAGNOSIS — E7849 Other hyperlipidemia: Secondary | ICD-10-CM | POA: Diagnosis not present

## 2023-11-02 DIAGNOSIS — E1159 Type 2 diabetes mellitus with other circulatory complications: Secondary | ICD-10-CM | POA: Diagnosis not present

## 2023-11-02 DIAGNOSIS — Z0001 Encounter for general adult medical examination with abnormal findings: Secondary | ICD-10-CM | POA: Diagnosis not present

## 2023-11-02 DIAGNOSIS — R972 Elevated prostate specific antigen [PSA]: Secondary | ICD-10-CM | POA: Diagnosis not present

## 2023-11-02 DIAGNOSIS — Z6825 Body mass index (BMI) 25.0-25.9, adult: Secondary | ICD-10-CM | POA: Diagnosis not present

## 2023-11-02 DIAGNOSIS — E663 Overweight: Secondary | ICD-10-CM | POA: Diagnosis not present

## 2023-11-02 DIAGNOSIS — G20A1 Parkinson's disease without dyskinesia, without mention of fluctuations: Secondary | ICD-10-CM | POA: Diagnosis not present

## 2023-11-02 DIAGNOSIS — Z1331 Encounter for screening for depression: Secondary | ICD-10-CM | POA: Diagnosis not present

## 2023-11-02 DIAGNOSIS — E782 Mixed hyperlipidemia: Secondary | ICD-10-CM | POA: Diagnosis not present

## 2024-02-02 ENCOUNTER — Ambulatory Visit: Admitting: Neurology

## 2024-02-07 NOTE — Progress Notes (Unsigned)
 Assessment/Plan:  1.  Parkinsons disease, diagnosed November, 2024, but already had very significant rigidity and likely had the disease for quite some time             -Skin biopsy done positive for alpha synuclein in 03/2023.             -Does have bilateral caudate infarcts, which could, in theory, cause some degree of parkinsonism, but more likely to cause chorea.             -continue carbidopa /levodopa  25/100 tid  -We discussed that it used to be thought that levodopa  would increase risk of melanoma but now it is believed that Parkinsons itself likely increases risk of melanoma. he is to get regular skin checks.     2.  Right foot chorea, perhaps due to left caudate infarct  -this started long prior to the addition of levodopa .  I confirmed this again today.  I do not see any evidence of CBGD, which generally is not levodopa  responsive and can start with choreiform movements, but we will continue to monitor this.  His wife confirms that he has had this ever since they were married, and they have been married 55 years.             -They report he has had a history of lifelong fidgetiness, but the current on the right foot is really focal, and may be due to the caudate infarcts, particularly the left caudate infarct.  Thus far, lab work has been unremarkable.    3..  Sialorrhea             - They had reported that this was virtually lifelong, but he got markedly better when we added levodopa .  This is still pretty good    4..  Constipation             -he is actually only on metformin to help constipation.  He states that his primary care physician had told him to go off of it because he no longer needs it for the diabetes, but he got significantly constipated when he went off of it.  We talked about constipation as it relates to Parkinson's disease.  We talked about increased water  intake.   5.  Cerebellar cavernoma, right             -Patient has evidence of 10 mm cerebellar cavernoma  with evidence of prior bleed, hemosiderin deposits.  I wonder if this happened lastJune, when patient reported that he began to feel so weak and he attributed it to a tick bite.  Nonetheless, I wanted to get a neurosurgeon opinion given size of l lesion and evidence of prior bleed.  He doesn't want to be referred though and we will continue to discuss this  6.  Fatigue  -we discussed that fatigue is the number one treatment resistant complaint of Parkinsons Disease patients.  No matter what we do with the Parkinsons Disease medications (including d/c them), patients tend to c/o fatigue, unfortunately.    Subjective:   Hunter Peterson was seen today in the movement disorders clinic for follow-up for Parkinson's disease.  Pt with wife who supplements hx.  Patient remains on his levodopa  and is doing well with that.  He has had no falls.  No near syncopal episodes.  He has had a little dizziness today but hasn't hydrated well today.  He usually isn't dizzy.  He does c/o Mudlogger.    Current  movement disorder medications: carbidopa /levodopa  25/100, 1 tablet 3 times per day  ALLERGIES:  No Known Allergies  CURRENT MEDICATIONS:  Current Outpatient Medications  Medication Instructions   carbidopa -levodopa  (SINEMET  IR) 25-100 MG tablet 1 tablet, Oral, 3 times daily, 8am/noon/4pm   enalapril (VASOTEC) 2.5 mg, Every morning   metFORMIN (GLUCOPHAGE) 500 mg, 2 times daily with meals   metoprolol tartrate (LOPRESSOR) 25 mg,  Once   Saw Palmetto, Serenoa repens, (SAW PALMETTO PO) 1 tablet, 2 times daily   tamsulosin (FLOMAX) 0.4 mg, Every morning    Objective:   PHYSICAL EXAMINATION:    VITALS:   Vitals:   02/08/24 1509  BP: 132/70  Pulse: 82  SpO2: 98%  Weight: 164 lb 3.2 oz (74.5 kg)    GEN:  The patient appears stated age and is in NAD. HEENT:  Normocephalic, atraumatic.  The mucous membranes are moist. The superficial temporal arteries are without ropiness or tenderness. CV:   RRR Lungs:  CTAB Neck/HEME:  There are no carotid bruits bilaterally.  Neurological examination:  Orientation: The patient is alert and oriented x3.  Cranial nerves: There is mild L facial droop.  There is facial hypopmimia.  Patient has drooling with drool cloth present.  Extraocular muscles are intact. The visual fields are full to confrontational testing. The speech is fluent and clear. Soft palate rises symmetrically and there is no tongue deviation. Hearing is intact to conversational tone. Sensation: Sensation is intact to light touch throughout (facial, trunk, extremities). Vibration is intact at the bilateral big toe. There is no extinction with double simultaneous stimulation.  Motor: Strength is at least antigravity x 4   Movement examination: Tone: There is nl tone today Abnormal movements: No significant rest tremor today.  There is right leg dyskinesia. Coordination:  There is min decremation with finger taps on the R.  All other RAMs are normal.   Gait and Station: The patient has no difficulty arising out of a deep-seated chair without the use of the hands (improved).  The patient's stride length is good with no arm swing bilaterally.  This is stable from last visit (improved since starting carbidopa /levodopa  ) I have reviewed and interpreted the following labs independently   Chemistry      Component Value Date/Time   NA 134 (L) 02/23/2023 1025   K 4.0 02/23/2023 1025   CL 101 02/23/2023 1025   CO2 23 02/23/2023 1025   BUN 14 02/23/2023 1025   CREATININE 0.91 02/23/2023 1025      Component Value Date/Time   CALCIUM 8.9 02/23/2023 1025   ALKPHOS 55 02/23/2023 1025   AST 20 02/23/2023 1025   ALT 24 02/23/2023 1025   BILITOT 2.0 (H) 02/23/2023 1025       Lab Results  Component Value Date   WBC 8.3 02/23/2023   HGB 15.1 02/23/2023   HCT 43.3 02/23/2023   MCV 90.4 02/23/2023   PLT 242 02/23/2023      Cc:  Marvine Rush, MD

## 2024-02-08 ENCOUNTER — Encounter: Payer: Self-pay | Admitting: Neurology

## 2024-02-08 ENCOUNTER — Ambulatory Visit (INDEPENDENT_AMBULATORY_CARE_PROVIDER_SITE_OTHER): Admitting: Neurology

## 2024-02-08 VITALS — BP 132/70 | HR 82 | Wt 164.2 lb

## 2024-02-08 DIAGNOSIS — G255 Other chorea: Secondary | ICD-10-CM

## 2024-02-08 DIAGNOSIS — R5383 Other fatigue: Secondary | ICD-10-CM | POA: Diagnosis not present

## 2024-02-08 DIAGNOSIS — G20A1 Parkinson's disease without dyskinesia, without mention of fluctuations: Secondary | ICD-10-CM

## 2024-02-08 MED ORDER — CARBIDOPA-LEVODOPA 25-100 MG PO TABS: 1.0000 | ORAL_TABLET | Freq: Three times a day (TID) | ORAL | 1 refills | Status: DC

## 2024-02-08 NOTE — Patient Instructions (Signed)
 You are looking great!  Join us  on social media and stay up to date with what is going on in the [place] regarding Parkinson's Disease and Movement Disorders!

## 2024-02-09 ENCOUNTER — Ambulatory Visit: Admitting: Neurology

## 2024-03-15 ENCOUNTER — Other Ambulatory Visit: Payer: Self-pay | Admitting: Neurology

## 2024-03-15 DIAGNOSIS — G20A1 Parkinson's disease without dyskinesia, without mention of fluctuations: Secondary | ICD-10-CM

## 2024-08-14 ENCOUNTER — Ambulatory Visit: Admitting: Neurology
